# Patient Record
Sex: Female | Born: 1985 | Race: Black or African American | Hispanic: No | Smoking: Never smoker
Health system: Southern US, Community
[De-identification: ages and names within clinical notes are randomized; demographics above are authoritative.]

## PROBLEM LIST (undated history)

## (undated) DIAGNOSIS — D649 Anemia, unspecified: Secondary | ICD-10-CM

## (undated) HISTORY — PX: WISDOM TOOTH EXTRACTION: SHX21

## (undated) HISTORY — PX: BREAST BIOPSY: SHX20

---

## 2010-05-27 ENCOUNTER — Inpatient Hospital Stay (HOSPITAL_COMMUNITY): Admission: AD | Admit: 2010-05-27 | Discharge: 2010-05-27 | Payer: Self-pay | Admitting: Obstetrics & Gynecology

## 2010-05-31 ENCOUNTER — Inpatient Hospital Stay (HOSPITAL_COMMUNITY): Admission: AD | Admit: 2010-05-31 | Discharge: 2010-06-03 | Payer: Self-pay | Admitting: Obstetrics & Gynecology

## 2010-09-16 LAB — CBC
MCH: 25.8 pg — ABNORMAL LOW (ref 26.0–34.0)
MCHC: 32.5 g/dL (ref 30.0–36.0)
Platelets: 228 10*3/uL (ref 150–400)
RBC: 4.43 MIL/uL (ref 3.87–5.11)

## 2010-09-16 LAB — RH IMMUNE GLOB WKUP(>/=20WKS)(NOT WOMEN'S HOSP): Fetal Screen: NEGATIVE

## 2010-09-16 LAB — RPR: RPR Ser Ql: NONREACTIVE

## 2011-02-06 ENCOUNTER — Other Ambulatory Visit (HOSPITAL_COMMUNITY)
Admission: RE | Admit: 2011-02-06 | Discharge: 2011-02-06 | Disposition: A | Discharge status: Home / Self Care | Payer: BC Managed Care – PPO | Source: Ambulatory Visit | Attending: Obstetrics & Gynecology | Admitting: Obstetrics & Gynecology

## 2011-02-06 DIAGNOSIS — Z01419 Encounter for gynecological examination (general) (routine) without abnormal findings: Secondary | ICD-10-CM | POA: Insufficient documentation

## 2011-02-06 DIAGNOSIS — Z113 Encounter for screening for infections with a predominantly sexual mode of transmission: Secondary | ICD-10-CM | POA: Insufficient documentation

## 2011-07-07 NOTE — L&D Delivery Note (Signed)
Delivery Note Progressed quickly to complete dilation and began pushing. At 3:27 PM a viable and healthy female was delivered via Vaginal, Spontaneous Delivery (Presentation: Left Occiput Anterior).  APGAR: 9, 9; weight .   Placenta status: Grossly Intact, Spontaneous.  Cord: 3 vessels with the following complications: None.    No difficulty with shoulders  Anesthesia: Epidural  Episiotomy: None Lacerations: Abrasion at fourchette. Suture Repair: none Est. Blood Loss (mL): 100  Mom to postpartum.  Baby to with mother.  [facility] 12/26/2011, 4:36 PM

## 2011-08-19 LAB — OB RESULTS CONSOLE GC/CHLAMYDIA
Chlamydia: NEGATIVE
Gonorrhea: NEGATIVE

## 2011-12-08 LAB — OB RESULTS CONSOLE GBS: GBS: POSITIVE

## 2011-12-13 ENCOUNTER — Inpatient Hospital Stay (HOSPITAL_COMMUNITY)
Admission: AD | Admit: 2011-12-13 | Discharge: 2011-12-13 | Disposition: A | Discharge status: Home / Self Care | Payer: BC Managed Care – PPO | Source: Ambulatory Visit | Attending: Obstetrics & Gynecology | Admitting: Obstetrics & Gynecology

## 2011-12-13 ENCOUNTER — Encounter (HOSPITAL_COMMUNITY): Payer: Self-pay | Admitting: *Deleted

## 2011-12-13 DIAGNOSIS — O479 False labor, unspecified: Secondary | ICD-10-CM | POA: Insufficient documentation

## 2011-12-13 DIAGNOSIS — R109 Unspecified abdominal pain: Secondary | ICD-10-CM | POA: Insufficient documentation

## 2011-12-13 HISTORY — DX: Anemia, unspecified: D64.9

## 2011-12-13 MED ORDER — CYCLOBENZAPRINE HCL 10 MG PO TABS
10.0000 mg | ORAL_TABLET | Freq: Once | ORAL | Status: AC
  Administered 2011-12-13: 10 mg via ORAL
  Filled 2011-12-13: qty 1

## 2011-12-13 NOTE — MAU Note (Signed)
Pt reports her mucus plug had come out and has had constant  pelvic pressure with cramping in between. Reports good fetal movement

## 2011-12-13 NOTE — Progress Notes (Signed)
Roney Marion CNM notified of patients cervical exam closed, thick, soft, not able to determine if fetus is vertex. CNM on her way to see the patient.

## 2011-12-25 ENCOUNTER — Encounter (HOSPITAL_COMMUNITY): Payer: Self-pay

## 2011-12-25 ENCOUNTER — Inpatient Hospital Stay (HOSPITAL_COMMUNITY)
Admission: AD | Admit: 2011-12-25 | Discharge: 2011-12-28 | DRG: 372 | Disposition: A | Discharge status: Home / Self Care | Payer: BC Managed Care – PPO | Source: Ambulatory Visit | Attending: Obstetrics and Gynecology | Admitting: Obstetrics and Gynecology

## 2011-12-25 DIAGNOSIS — Z22330 Carrier of Group B streptococcus: Secondary | ICD-10-CM

## 2011-12-25 DIAGNOSIS — O4290 Premature rupture of membranes, unspecified as to length of time between rupture and onset of labor, unspecified weeks of gestation: Principal | ICD-10-CM | POA: Diagnosis present

## 2011-12-25 DIAGNOSIS — Z3490 Encounter for supervision of normal pregnancy, unspecified, unspecified trimester: Secondary | ICD-10-CM

## 2011-12-25 DIAGNOSIS — A491 Streptococcal infection, unspecified site: Secondary | ICD-10-CM | POA: Diagnosis present

## 2011-12-25 DIAGNOSIS — O99892 Other specified diseases and conditions complicating childbirth: Secondary | ICD-10-CM | POA: Diagnosis present

## 2011-12-25 DIAGNOSIS — O26899 Other specified pregnancy related conditions, unspecified trimester: Secondary | ICD-10-CM | POA: Diagnosis present

## 2011-12-25 DIAGNOSIS — Z6791 Unspecified blood type, Rh negative: Secondary | ICD-10-CM | POA: Diagnosis present

## 2011-12-25 LAB — CBC
HCT: 31 % — ABNORMAL LOW (ref 36.0–46.0)
Hemoglobin: 9.8 g/dL — ABNORMAL LOW (ref 12.0–15.0)
MCHC: 31.6 g/dL (ref 30.0–36.0)
MCV: 74 fL — ABNORMAL LOW (ref 78.0–100.0)
RDW: 15.3 % (ref 11.5–15.5)

## 2011-12-25 MED ORDER — IBUPROFEN 600 MG PO TABS: 600.0000 mg | ORAL_TABLET | Freq: Four times a day (QID) | ORAL | Status: DC | PRN

## 2011-12-25 MED ORDER — ACETAMINOPHEN 325 MG PO TABS: 650.0000 mg | ORAL_TABLET | ORAL | Status: DC | PRN

## 2011-12-25 MED ORDER — LACTATED RINGERS IV SOLN: 500.0000 mL | INTRAVENOUS | Status: DC | PRN

## 2011-12-25 MED ORDER — MISOPROSTOL 25 MCG QUARTER TABLET
25.0000 ug | ORAL_TABLET | ORAL | Status: DC | PRN
  Administered 2011-12-25: 25 ug via VAGINAL
  Filled 2011-12-25 (×2): qty 0.25

## 2011-12-25 MED ORDER — OXYTOCIN 40 UNITS IN LACTATED RINGERS INFUSION - SIMPLE MED
62.5000 mL/h | Freq: Once | INTRAVENOUS | Status: AC
  Administered 2011-12-26: 2.5 [IU]/h via INTRAVENOUS

## 2011-12-25 MED ORDER — CITRIC ACID-SODIUM CITRATE 334-500 MG/5ML PO SOLN: 30.0000 mL | ORAL | Status: DC | PRN

## 2011-12-25 MED ORDER — ONDANSETRON HCL 4 MG/2ML IJ SOLN: 4.0000 mg | Freq: Four times a day (QID) | INTRAMUSCULAR | Status: DC | PRN

## 2011-12-25 MED ORDER — OXYCODONE-ACETAMINOPHEN 5-325 MG PO TABS: 1.0000 | ORAL_TABLET | ORAL | Status: DC | PRN

## 2011-12-25 MED ORDER — LACTATED RINGERS IV SOLN
INTRAVENOUS | Status: DC
  Administered 2011-12-25 – 2011-12-26 (×3): via INTRAVENOUS

## 2011-12-25 MED ORDER — PENICILLIN G POTASSIUM 5000000 UNITS IJ SOLR
2.5000 10*6.[IU] | INTRAVENOUS | Status: DC
  Administered 2011-12-25 – 2011-12-26 (×5): 2.5 10*6.[IU] via INTRAVENOUS
  Filled 2011-12-25 (×9): qty 2.5

## 2011-12-25 MED ORDER — PENICILLIN G POTASSIUM 5000000 UNITS IJ SOLR
5.0000 10*6.[IU] | Freq: Once | INTRAVENOUS | Status: AC
  Administered 2011-12-25: 5 10*6.[IU] via INTRAVENOUS
  Filled 2011-12-25: qty 5

## 2011-12-25 MED ORDER — OXYTOCIN BOLUS FROM INFUSION
250.0000 mL | Freq: Once | INTRAVENOUS | Status: AC
  Administered 2011-12-26: 250 mL via INTRAVENOUS
  Filled 2011-12-25: qty 500

## 2011-12-25 MED ORDER — FLEET ENEMA 7-19 GM/118ML RE ENEM: 1.0000 | ENEMA | RECTAL | Status: DC | PRN

## 2011-12-25 MED ORDER — TERBUTALINE SULFATE 1 MG/ML IJ SOLN: 0.2500 mg | Freq: Once | INTRAMUSCULAR | Status: AC | PRN

## 2011-12-25 MED ORDER — PENICILLIN G POTASSIUM 5000000 UNITS IJ SOLR: 5.0000 10*6.[IU] | Freq: Once | INTRAVENOUS | Status: DC

## 2011-12-25 MED ORDER — PENICILLIN G POTASSIUM 5000000 UNITS IJ SOLR: 2.5000 10*6.[IU] | INTRAVENOUS | Status: DC

## 2011-12-25 MED ORDER — LIDOCAINE HCL (PF) 1 % IJ SOLN: 30.0000 mL | INTRAMUSCULAR | Status: DC | PRN

## 2011-12-25 NOTE — MAU Note (Signed)
Pt states was 3cm/-1/50%, ?SRom at 1000, clear fluid, non-odorous. Ctx's were q64minutes.

## 2011-12-25 NOTE — Progress Notes (Signed)
Beth Hansen is a 26 y.o. G2P1001 at [redacted]w[redacted]d   Subjective: No complaints.  Discussed plans for induction.  Family members in room  Objective: BP 137/81  Pulse 95  Temp 97.7 F (36.5 C) (Oral)  Resp 20  Ht 5\' 7"  (1.702 m)  Wt 102.967 kg (227 lb)  BMI 35.55 kg/m2  SpO2 100%      FHT:  FHR: 140 bpm, variability: moderate,  accelerations:  Present,  decelerations:  Absent UC:   Regular when able to capture, pt laughing constantly SVE:   Dilation: Closed Effacement (%): Thick Station: -2 Exam by:: Dr Lahoma Rocker  Labs: Lab Results  Component Value Date   WBC 8.2 12/25/2011   HGB 9.8* 12/25/2011   HCT 31.0* 12/25/2011   MCV 74.0* 12/25/2011   PLT 198 12/25/2011    Assessment / Plan: Augmentation of labor, progressing well  Labor: On Cytotec, will recheck cervix at 4o s/p last dose Preeclampsia:  n/a Fetal Wellbeing:  Category I Pain Control:  Labor support without medications and pt requesting epidural if pitocin required I/D:  PCN Anticipated MOD:  NSVD  Andrena Mews, DO [facility] Resident - PGY-1 12/25/2011 9:37 PM

## 2011-12-25 NOTE — MAU Note (Signed)
Patient states she had a gush of clear fluid at 1000. Continues to have a little leaking but not wearing a pad. States contractions every 15 minutes. States no fetal movement today. Fetal heart rate 160 to 175 in triage.

## 2011-12-25 NOTE — H&P (Signed)
S: This is a 26 year old G2P1001 at [redacted]w[redacted]d presenting with ROM approximately 10:30am this morning described as a moderate amount of clear fluid that has continued to leak slowly while laying in bed. She also states that her contractions began yesterday (12/24/11) afternoon that were approximately 15 minutes apart and unknown duration which have continued through this morning when ROM took place. Patient receiving care at [facility]  Complications this pregnancy: none  Contractions: q 9 min  Membranes: ruptured Vaginal bleeding: n/a Vaginal discharge: clear fluid Fetal movement: none today  O: BP 134/73  Pulse 79  Temp 97.9 F (36.6 C) (Oral)  Resp 18  Ht 5\' 7"  (1.702 m)  Wt 102.967 kg (227 lb)  BMI 35.55 kg/m2  SpO2 100%  Filed Vitals:   12/25/11 1359  BP: 134/73  Pulse: 79  Temp: 97.9 F (36.6 C)  Resp: 18   ROS: Constitutional: Decreased appetite. Denies fever, chills, diaphoresis, and fatigue.  HEENT: Denies photophobia, eye pain, redness, hearing loss, ear pain, congestion, sore throat, rhinorrhea, sneezing, mouth sores, trouble swallowing, neck pain, neck stiffness and tinnitus.   Respiratory: Denies SOB, DOE, cough, chest tightness,  and wheezing.   Cardiovascular: Minor leg swelling. Denies chest pain, palpitations Gastrointestinal: Denies nausea, vomiting, abdominal pain, diarrhea, constipation, blood in stool. Genitourinary: Burning on urination s/p ROM.  Musculoskeletal: Denies myalgias, joint swelling, arthralgias and gait problem.  Skin: Denies pallor, rash and wound.  Neurological: Denies dizziness, seizures, syncope, weakness, light-headedness, numbness and headaches.  Hematological: Denies adenopathy. Psychiatric/Behavioral: Denies mood changes, confusion, nervousness, sleep disturbance and agitation  Physical Exam: General: Patient is a well-developed and well-nourished female in no acute distress and cooperative with exam. Alert and oriented x3.  Head:  Normocephalic and atraumatic Mouth: no erythema or exudates, MMM Eyes: EOMI, conjunctivae normal, No scleral icterus.  Neck: Supple, Trachea midline normal ROM, No JVD, mass, thyromegaly, or carotid bruit present.  Cardiovascular: RRR, S1 normal, S2 normal, no MRG, pulses symmetric and intact bilaterally Pulmonary/Chest: CTAB, no wheezes, rales, or rhonchi Abdominal: Gravid, size c/w dates, Non-tender, bowel sounds are normal, no masses, organomegaly, or guarding present.  GU: no CVA tenderness Musculoskeletal: No joint deformities, erythema, or stiffness, ROM full and no nontender Hematology: no cervical, inginal, or axillary adenopathy.  Neurological: A&O x3, cranial nerve II-XII are grossly intact, no focal motor deficit, sensory intact to light touch bilaterally.  Skin: Warm, dry and intact. No rash, cyanosis, or clubbing.  Psychiatric: Normal mood and affect. speech and behavior is normal. Judgment and thought content normal. Cognition and memory are normal.  SVE: deferred FHT: 150bpm, mod variability, + accels, no decels Ctx: q65min  Prenatal labs: ABO, Rh:  O neg Antibody:  Pos Rubella:  Immune RPR:   NR HBsAg: Neg  HIV:   Neg GBS:   Positive HSV: Negative Hgb/Plt:  9.8 / 198 2 hour GTT: 16,10,96 (normal) 1st trimester screen: normal  Labs:  Results for orders placed during the hospital encounter of 12/25/11 (from the past 24 hour(s))  CBC     Status: Abnormal   Collection Time   12/25/11  2:10 PM      Component Value Range   WBC 8.2  4.0 - 10.5 K/uL   RBC 4.19  3.87 - 5.11 MIL/uL   Hemoglobin 9.8 (*) 12.0 - 15.0 g/dL   HCT 04.5 (*) 40.9 - 81.1 %   MCV 74.0 (*) 78.0 - 100.0 fL   MCH 23.4 (*) 26.0 - 34.0 pg   MCHC  31.6  30.0 - 36.0 g/dL   RDW 54.0  98.1 - 19.1 %   Platelets 198  150 - 400 K/uL  TYPE AND SCREEN     Status: Normal   Collection Time   12/25/11  2:10 PM      Component Value Range   ABO/RH(D) O NEG     Antibody Screen POS     Sample Expiration  12/28/2011       A/P: 26 year old G2P1001 @ [redacted]w[redacted]d presents with SROM and ctx q 15 min. 1. Admit to L&D 2. Expectant management, anticipate vaginal delivery 3. Antibiotics, PCN 2/2 SROM and GBS pos. 4. Continuous toco/FHT 5. Epidural for pain when 8/10 per patient  I was present for the exam and agree with above.  [place], [place] 12/25/2011 5:23 PM

## 2011-12-26 ENCOUNTER — Inpatient Hospital Stay (HOSPITAL_COMMUNITY): Payer: BC Managed Care – PPO | Admitting: Anesthesiology

## 2011-12-26 ENCOUNTER — Encounter (HOSPITAL_COMMUNITY): Payer: Self-pay | Admitting: *Deleted

## 2011-12-26 ENCOUNTER — Encounter (HOSPITAL_COMMUNITY): Payer: Self-pay | Admitting: Anesthesiology

## 2011-12-26 DIAGNOSIS — Z22330 Carrier of Group B streptococcus: Secondary | ICD-10-CM

## 2011-12-26 DIAGNOSIS — A491 Streptococcal infection, unspecified site: Secondary | ICD-10-CM | POA: Diagnosis present

## 2011-12-26 DIAGNOSIS — O4290 Premature rupture of membranes, unspecified as to length of time between rupture and onset of labor, unspecified weeks of gestation: Secondary | ICD-10-CM

## 2011-12-26 DIAGNOSIS — Z3490 Encounter for supervision of normal pregnancy, unspecified, unspecified trimester: Secondary | ICD-10-CM

## 2011-12-26 DIAGNOSIS — O9989 Other specified diseases and conditions complicating pregnancy, childbirth and the puerperium: Secondary | ICD-10-CM

## 2011-12-26 DIAGNOSIS — Z6791 Unspecified blood type, Rh negative: Secondary | ICD-10-CM | POA: Diagnosis present

## 2011-12-26 MED ORDER — PHENYLEPHRINE 40 MCG/ML (10ML) SYRINGE FOR IV PUSH (FOR BLOOD PRESSURE SUPPORT): 80.0000 ug | PREFILLED_SYRINGE | INTRAVENOUS | Status: DC | PRN

## 2011-12-26 MED ORDER — FENTANYL 2.5 MCG/ML BUPIVACAINE 1/10 % EPIDURAL INFUSION (WH - ANES)
14.0000 mL/h | INTRAMUSCULAR | Status: DC
  Filled 2011-12-26: qty 60

## 2011-12-26 MED ORDER — OXYTOCIN 40 UNITS IN LACTATED RINGERS INFUSION - SIMPLE MED
1.0000 m[IU]/min | INTRAVENOUS | Status: DC
  Administered 2011-12-26: 1 m[IU]/min via INTRAVENOUS
  Filled 2011-12-26: qty 1000

## 2011-12-26 MED ORDER — TERBUTALINE SULFATE 1 MG/ML IJ SOLN: 0.2500 mg | Freq: Once | INTRAMUSCULAR | Status: DC | PRN

## 2011-12-26 MED ORDER — LANOLIN HYDROUS EX OINT: TOPICAL_OINTMENT | CUTANEOUS | Status: DC | PRN

## 2011-12-26 MED ORDER — EPHEDRINE 5 MG/ML INJ: 10.0000 mg | INTRAVENOUS | Status: DC | PRN

## 2011-12-26 MED ORDER — FENTANYL 2.5 MCG/ML BUPIVACAINE 1/10 % EPIDURAL INFUSION (WH - ANES)
INTRAMUSCULAR | Status: DC | PRN
  Administered 2011-12-26: 14 mL/h via EPIDURAL

## 2011-12-26 MED ORDER — OXYCODONE-ACETAMINOPHEN 5-325 MG PO TABS: 1.0000 | ORAL_TABLET | ORAL | Status: DC | PRN

## 2011-12-26 MED ORDER — LACTATED RINGERS IV SOLN
500.0000 mL | Freq: Once | INTRAVENOUS | Status: AC
  Administered 2011-12-26: 1000 mL via INTRAVENOUS

## 2011-12-26 MED ORDER — EPHEDRINE 5 MG/ML INJ
10.0000 mg | INTRAVENOUS | Status: DC | PRN
Start: 2011-12-26 — End: 2011-12-26
  Filled 2011-12-26: qty 4

## 2011-12-26 MED ORDER — IBUPROFEN 600 MG PO TABS
600.0000 mg | ORAL_TABLET | Freq: Four times a day (QID) | ORAL | Status: DC
  Administered 2011-12-26 – 2011-12-28 (×7): 600 mg via ORAL
  Filled 2011-12-26 (×7): qty 1

## 2011-12-26 MED ORDER — ZOLPIDEM TARTRATE 5 MG PO TABS: 5.0000 mg | ORAL_TABLET | Freq: Every evening | ORAL | Status: DC | PRN

## 2011-12-26 MED ORDER — WITCH HAZEL-GLYCERIN EX PADS
1.0000 "application " | MEDICATED_PAD | CUTANEOUS | Status: DC | PRN
  Administered 2011-12-26: 1 via TOPICAL

## 2011-12-26 MED ORDER — BENZOCAINE-MENTHOL 20-0.5 % EX AERO
1.0000 "application " | INHALATION_SPRAY | CUTANEOUS | Status: DC | PRN
  Administered 2011-12-26: 1 via TOPICAL
  Filled 2011-12-26: qty 56

## 2011-12-26 MED ORDER — DIPHENHYDRAMINE HCL 25 MG PO CAPS: 25.0000 mg | ORAL_CAPSULE | Freq: Four times a day (QID) | ORAL | Status: DC | PRN

## 2011-12-26 MED ORDER — SENNOSIDES-DOCUSATE SODIUM 8.6-50 MG PO TABS
2.0000 | ORAL_TABLET | Freq: Every day | ORAL | Status: DC
  Administered 2011-12-26 – 2011-12-27 (×2): 2 via ORAL

## 2011-12-26 MED ORDER — ONDANSETRON HCL 4 MG PO TABS: 4.0000 mg | ORAL_TABLET | ORAL | Status: DC | PRN

## 2011-12-26 MED ORDER — TETANUS-DIPHTH-ACELL PERTUSSIS 5-2.5-18.5 LF-MCG/0.5 IM SUSP
0.5000 mL | Freq: Once | INTRAMUSCULAR | Status: AC
  Administered 2011-12-27: 0.5 mL via INTRAMUSCULAR
  Filled 2011-12-26: qty 0.5

## 2011-12-26 MED ORDER — ONDANSETRON HCL 4 MG/2ML IJ SOLN: 4.0000 mg | INTRAMUSCULAR | Status: DC | PRN

## 2011-12-26 MED ORDER — PHENYLEPHRINE 40 MCG/ML (10ML) SYRINGE FOR IV PUSH (FOR BLOOD PRESSURE SUPPORT)
80.0000 ug | PREFILLED_SYRINGE | INTRAVENOUS | Status: DC | PRN
  Filled 2011-12-26: qty 5

## 2011-12-26 MED ORDER — PRENATAL MULTIVITAMIN CH
1.0000 | ORAL_TABLET | Freq: Every day | ORAL | Status: DC
  Administered 2011-12-27 – 2011-12-28 (×2): 1 via ORAL
  Filled 2011-12-26 (×2): qty 1

## 2011-12-26 MED ORDER — NALBUPHINE SYRINGE 5 MG/0.5 ML
10.0000 mg | INJECTION | INTRAMUSCULAR | Status: DC | PRN
  Administered 2011-12-26: 10 mg via INTRAVENOUS
  Filled 2011-12-26: qty 1

## 2011-12-26 MED ORDER — DIBUCAINE 1 % RE OINT
1.0000 "application " | TOPICAL_OINTMENT | RECTAL | Status: DC | PRN
  Administered 2011-12-26: 1 via RECTAL
  Filled 2011-12-26: qty 28

## 2011-12-26 MED ORDER — SIMETHICONE 80 MG PO CHEW: 80.0000 mg | CHEWABLE_TABLET | ORAL | Status: DC | PRN

## 2011-12-26 MED ORDER — SODIUM BICARBONATE 8.4 % IV SOLN
INTRAVENOUS | Status: DC | PRN
  Administered 2011-12-26: 4 mL via EPIDURAL

## 2011-12-26 MED ORDER — DIPHENHYDRAMINE HCL 50 MG/ML IJ SOLN: 12.5000 mg | INTRAMUSCULAR | Status: DC | PRN

## 2011-12-26 NOTE — Progress Notes (Signed)
Patient ID: Beth Hansen, female   DOB: 1986/04/20, 26 y.o.   MRN: [id number]  Doing well but hurting more  FHR stable and reassuring UCs every 2-4 minutes  Cervix:  Dilation: 4 Effacement (%): 80 Cervical Position: Posterior Station: -3 Presentation: Vertex Exam by:: Taiana Temkin, cnm  AROM forebag for clear fluids  Will continue plan of care

## 2011-12-26 NOTE — Progress Notes (Signed)
Beth Hansen is a 26 y.o. G2P1001 at [redacted]w[redacted]d Subjective: Some discomfort with contractions, slight dizziness  Objective: BP 130/74  Pulse 80  Temp 97.8 F (36.6 C) (Oral)  Resp 18  Ht 5\' 7"  (1.702 m)  Wt 102.967 kg (227 lb)  BMI 35.55 kg/m2  SpO2 100%      FHT:  FHR: 140 bpm, variability: moderate,  accelerations:  Present,  decelerations:  Absent UC:   regular, every 2-5 minutes SVE:   Dilation: 3.5 Effacement (%): 50 Station: -2 Exam by:: Berline Chough DO  Labs: Lab Results  Component Value Date   WBC 8.2 12/25/2011   HGB 9.8* 12/25/2011   HCT 31.0* 12/25/2011   MCV 74.0* 12/25/2011   PLT 198 12/25/2011    Assessment / Plan: Protracted latent phase  Labor: Foley out, will start Pitocin 1X1 Preeclampsia:  n/a Fetal Wellbeing:  Category I Pain Control:  Nubain, plans for epidural I/D:  PCN Anticipated MOD:  NSVD  Andrena Mews, DO [facility] Resident - PGY-1 12/26/2011 6:13 AM

## 2011-12-26 NOTE — Progress Notes (Signed)
Beth Hansen is a 26 y.o. G2P1001 at [redacted]w[redacted]d  Subjective: Sleeping comfortably.  Contracting regularly  Objective: BP 105/54  Pulse 96  Temp 98.7 F (37.1 C) (Oral)  Resp 18  Ht 5\' 7"  (1.702 m)  Wt 102.967 kg (227 lb)  BMI 35.55 kg/m2  SpO2 100%      FHT:  FHR: 145 bpm, variability: moderate,  accelerations:  Present,  decelerations:  Absent UC:   regular, every 2-4 minutes SVE:   Dilation: 1.5 Effacement (%): 50 Station: -2 Exam by:: Berline Chough DO  Labs: Lab Results  Component Value Date   WBC 8.2 12/25/2011   HGB 9.8* 12/25/2011   HCT 31.0* 12/25/2011   MCV 74.0* 12/25/2011   PLT 198 12/25/2011    Assessment / Plan: Augmentation of labor, progressing well  Labor: S/p cytotec, regular contractions, cervical changes; FOLEY PLACED Preeclampsia:  n/a Fetal Wellbeing:  Category I Pain Control:  Labor support without medications and pt to request epidural when contractions increase I/D:  PCN Anticipated MOD:  NSVD  Andrena Mews, DO [facility] Resident - PGY-1 12/26/2011 2:01 AM

## 2011-12-26 NOTE — Progress Notes (Signed)
Beth Hansen is a 26 y.o. G2P1001 at [redacted]w[redacted]d by Subjective: Resting comfortable, no complaints  Objective: BP 105/54  Pulse 96  Temp 98.9 F (37.2 C) (Oral)  Resp 20  Ht 5\' 7"  (1.702 m)  Wt 102.967 kg (227 lb)  BMI 35.55 kg/m2  SpO2 100%      FHT:  FHR: 140 bpm, variability: moderate,  accelerations:  Present,  decelerations:  Absent UC:   regular, every 2-3 minutes SVE:   Dilation: Fingertip Effacement (%): Thick Station: -2 Exam by:: Berline Chough DO  Labs: Lab Results  Component Value Date   WBC 8.2 12/25/2011   HGB 9.8* 12/25/2011   HCT 31.0* 12/25/2011   MCV 74.0* 12/25/2011   PLT 198 12/25/2011    Assessment / Plan: Augmentation of labor, progressing well  Labor: Progressing with regular contractions since last cytotec Preeclampsia:  na Fetal Wellbeing:  Category I Pain Control:  Labor support without medications and requesting epidural when contractions worsen I/D:  PCN Anticipated MOD:  NSVD  Andrena Mews, DO [facility] Resident - PGY-1 12/26/2011 12:18 AM

## 2011-12-26 NOTE — Progress Notes (Signed)
  Patient ID: Beth Hansen, female   DOB: October 09, 1985, 26 y.o.   MRN: [id number]  S/O:  Doing well. Wants to be checked          Nubain helps with pain.  Not ready for epidural yet          FHR reactive          UCs every 3 min or so, moderate          Cervix:  Deferred  A:  SIUP at [redacted]w[redacted]d       Latent phase labor      PROM at term      Pitocin Augmentation  P:  DIscussed with patient our usual practice with ROM is to limit exams       Discussed pain management       Will recheck cervix when wants more pain med or epidural

## 2011-12-26 NOTE — Anesthesia Preprocedure Evaluation (Addendum)

## 2011-12-26 NOTE — Anesthesia Procedure Notes (Signed)

## 2011-12-27 MED ORDER — RHO D IMMUNE GLOBULIN 1500 UNIT/2ML IJ SOLN
300.0000 ug | Freq: Once | INTRAMUSCULAR | Status: AC
  Administered 2011-12-27: 300 ug via INTRAMUSCULAR
  Filled 2011-12-27: qty 2

## 2011-12-27 NOTE — Progress Notes (Signed)
Post Partum Day 1 Subjective: no complaints, up ad lib, voiding and tolerating PO  Objective: Blood pressure 111/71, pulse 80, temperature 98.1 F (36.7 C), temperature source Oral, resp. rate 18, height 5\' 7"  (1.702 m), weight 102.967 kg (227 lb), SpO2 100.00%, unknown if currently breastfeeding.  Physical Exam:  General: alert, cooperative, appears stated age and no distress Lochia: appropriate Uterine Fundus: firm Incision: n/a DVT Evaluation: No evidence of DVT seen on physical exam. Negative Homan's sign. No cords or calf tenderness. No significant calf/ankle edema.   Basename 12/25/11 1410  HGB 9.8*  HCT 31.0*    Assessment/Plan: Plan for discharge tomorrow Mirena for contraceptions Lactation consult   LOS: 2 days   Andrena Mews, DO [facility] Resident - PGY-1 12/27/2011 7:17 AM

## 2011-12-27 NOTE — Progress Notes (Signed)
I have seen this patient and agree with the above resident's note.  LEFTWICH-KIRBY, Kyliana Standen Certified Nurse-Midwife 

## 2011-12-27 NOTE — Anesthesia Postprocedure Evaluation (Signed)
Anesthesia Post Note  Patient: Beth Hansen  Procedure(s) Performed: * No procedures listed *  Anesthesia type: Epidural  Patient location: Mother/Baby  Post pain: Pain level controlled  Post assessment: Post-op Vital signs reviewed  Last Vitals:  Filed Vitals:   12/27/11 0500  BP: 111/71  Pulse: 80  Temp: 36.7 C  Resp: 18    Post vital signs: Reviewed  Level of consciousness: awake  Complications: No apparent anesthesia complications

## 2011-12-28 LAB — RH IG WORKUP (INCLUDES ABO/RH)
ABO/RH(D): O NEG
Fetal Screen: NEGATIVE
Gestational Age(Wks): 38.2

## 2011-12-28 MED ORDER — PRENATAL MULTIVITAMIN CH: 1.0000 | ORAL_TABLET | Freq: Every day | ORAL | Status: DC

## 2011-12-28 MED ORDER — IBUPROFEN 600 MG PO TABS: 600.0000 mg | ORAL_TABLET | Freq: Four times a day (QID) | ORAL | Status: AC

## 2011-12-28 NOTE — H&P (Signed)
Agree with above note.  Beth Hansen 12/28/2011 1:17 PM

## 2011-12-28 NOTE — Discharge Instructions (Signed)
Vaginal Delivery Care After  Change your pad on each trip to the bathroom.   Wipe gently with toilet paper during your hospital stay. Always wipe from front to back. A spray bottle with warm tap water could also be used or a towelette if available.   Place your soiled pad and toilet paper in a bathroom wastebasket with a plastic bag liner.   During your hospital stay, save any clots. If you pass a clot while on the toilet, do not flush it. Also, if your vaginal flow seems excessive to you, notify nursing personnel.   The first time you get out of bed after delivery, wait for assistance from a nurse. Do not get up alone at any time if you feel weak or dizzy.   Bend and extend your ankles forcefully so that you feel the calves of your legs get hard. Do this 6 times every hour when you are in bed and awake.   Do not sit with one foot under you, dangle your legs over the edge of the bed, or maintain a position that hinders the circulation in your legs.   Many women experience after pains for 2 to 3 days after delivery. These after pains are mild uterine contractions. Ask the nurse for a pain medication if you need something for this. Sometimes breastfeeding stimulates after pains; if you find this to be true, ask for the medication  -  hour before the next feeding.   For you and your infant's protection, do not go beyond the door(s) of the obstetric unit. Do not carry your baby in your arms in the hallway. When taking your baby to and from your room, put your baby in the bassinet and push the bassinet.   Mothers may have their babies in their room as much as they desire.  Document Released: 06/19/2000 Document Revised: 06/11/2011 Document Reviewed: 05/20/2007 Exit[employer] Patient Information 2012 [employer]. 

## 2011-12-28 NOTE — Discharge Summary (Signed)
Obstetric Discharge Summary Reason for Admission: onset of labor Prenatal Procedures: none Intrapartum Procedures: spontaneous vaginal delivery Postpartum Procedures: none Complications-Operative and Postpartum: none Hemoglobin  Date Value Range Status  12/25/2011 9.8* 12.0 - 15.0 g/dL Final     HCT  Date Value Range Status  12/25/2011 31.0* 36.0 - 46.0 % Final    Physical Exam:  General: alert, cooperative, appears stated age and no distress Lochia: appropriate Uterine Fundus: firm Incision: n/a DVT Evaluation: No evidence of DVT seen on physical exam. Negative Homan's sign. No cords or calf tenderness.  Discharge Diagnoses: Term Pregnancy-delivered  Discharge Information: Date: 12/28/2011 Activity: pelvic rest Diet: routine Medications: Ibuprofen Condition: stable Instructions: refer to practice specific booklet Discharge to: home Follow-up Information    Follow up with Tilda Burrow, MD. Schedule an appointment as soon as possible for a visit in 6 weeks.   Contact information:   [facility] [address] [place] [address] [phone]          Newborn Data: Live born female  Birth Weight: 6 lb 12.8 oz (3085 g) APGAR: 9, 9  Home with mother.  Plans to breast/bottle feed Would like Mirena at 6 week PPV  RIGBY, MICHAEL 12/28/2011, 7:43 AM  I have seen and examined this patient and I agree with the above. Cam Hai 7:48 AM 12/28/2011

## 2011-12-29 LAB — TYPE AND SCREEN
Antibody Screen: POSITIVE
DAT, IgG: NEGATIVE
Unit division: 0

## 2012-01-05 NOTE — Discharge Summary (Signed)
Agree with above note.  LEGGETT,KELLY H. 01/05/2012 8:22 PM

## 2013-10-04 ENCOUNTER — Ambulatory Visit (INDEPENDENT_AMBULATORY_CARE_PROVIDER_SITE_OTHER): Payer: Medicaid Other | Admitting: Obstetrics & Gynecology

## 2013-10-04 ENCOUNTER — Other Ambulatory Visit (HOSPITAL_COMMUNITY)
Admission: RE | Admit: 2013-10-04 | Discharge: 2013-10-04 | Disposition: A | Discharge status: Home / Self Care | Payer: Medicaid Other | Source: Ambulatory Visit | Attending: Obstetrics & Gynecology | Admitting: Obstetrics & Gynecology

## 2013-10-04 ENCOUNTER — Encounter: Payer: Self-pay | Admitting: Obstetrics & Gynecology

## 2013-10-04 ENCOUNTER — Encounter (INDEPENDENT_AMBULATORY_CARE_PROVIDER_SITE_OTHER): Payer: Self-pay

## 2013-10-04 VITALS — BP 110/70 | Ht 69.0 in | Wt 183.0 lb

## 2013-10-04 DIAGNOSIS — Z3049 Encounter for surveillance of other contraceptives: Secondary | ICD-10-CM

## 2013-10-04 DIAGNOSIS — Z01419 Encounter for gynecological examination (general) (routine) without abnormal findings: Secondary | ICD-10-CM

## 2013-10-04 NOTE — Addendum Note (Signed)
Addended by: Doyne Keel on: 10/04/2013 04:07 PM   Modules accepted: Orders

## 2013-10-04 NOTE — Progress Notes (Signed)
Patient ID: Beth Hansen, female   DOB: Sep 04, 1985, 28 y.o.   MRN: [id number] Subjective:     Beth Hansen is a 28 y.o. female here for a routine exam.  Patient's last menstrual period was 09/17/2013. [id number] Birth Control Method:  mirena Menstrual Calendar(currently): irregular  Current complaints: none.  Want bilateral salpingectomy Current acute medical issues:  none   Recent Gynecologic History Patient's last menstrual period was 09/17/2013. Last Pap: 2013,  normal Last mammogram: ,    Past Medical History  Diagnosis Date  . Anemia     Past Surgical History  Procedure Laterality Date  . Wisdom tooth extraction      OB History   Grav Para Term Preterm Abortions TAB SAB Ect Mult Living   2 2 2  0 0 0 0 0 0 2      History   Social History  . Marital Status: Married    Spouse Name: N/A    Number of Children: N/A  . Years of Education: N/A   Social History Main Topics  . Smoking status: Never Smoker   . Smokeless tobacco: Never Used  . Alcohol Use: No  . Drug Use: No  . Sexual Activity: Yes    Birth Control/ Protection: None, IUD   Other Topics Concern  . None   Social History Narrative  . None    Family History  Problem Relation Age of Onset  . Other Neg Hx   . Hypertension Father   . Diabetes Maternal Grandmother   . Hypertension Maternal Grandmother      Review of Systems  Review of Systems  Constitutional: Negative for fever, chills, weight loss, malaise/fatigue and diaphoresis.  HENT: Negative for hearing loss, ear pain, nosebleeds, congestion, sore throat, neck pain, tinnitus and ear discharge.   Eyes: Negative for blurred vision, double vision, photophobia, pain, discharge and redness.  Respiratory: Negative for cough, hemoptysis, sputum production, shortness of breath, wheezing and stridor.   Cardiovascular: Negative for chest pain, palpitations, orthopnea, claudication, leg swelling and PND.  Gastrointestinal: negative for abdominal  pain. Negative for heartburn, nausea, vomiting, diarrhea, constipation, blood in stool and melena.  Genitourinary: Negative for dysuria, urgency, frequency, hematuria and flank pain.  Musculoskeletal: Negative for myalgias, back pain, joint pain and falls.  Skin: Negative for itching and rash.  Neurological: Negative for dizziness, tingling, tremors, sensory change, speech change, focal weakness, seizures, loss of consciousness, weakness and headaches.  Endo/Heme/Allergies: Negative for environmental allergies and polydipsia. Does not bruise/bleed easily.  Psychiatric/Behavioral: Negative for depression, suicidal ideas, hallucinations, memory loss and substance abuse. The patient is not nervous/anxious and does not have insomnia.        Objective:    Physical Exam   SKIN 2 moles 1 upper left chest the other behind left axilla Vitals reviewed. Constitutional: She is oriented to person, place, and time. She appears well-developed and well-nourished.  HENT:  Head: Normocephalic and atraumatic.        Right Ear: External ear normal.  Left Ear: External ear normal.  Nose: Nose normal.  Mouth/Throat: Oropharynx is clear and moist.  Eyes: Conjunctivae and EOM are normal. Pupils are equal, round, and reactive to light. Right eye exhibits no discharge. Left eye exhibits no discharge. No scleral icterus.  Neck: Normal range of motion. Neck supple. No tracheal deviation present. No thyromegaly present.  Cardiovascular: Normal rate, regular rhythm, normal heart sounds and intact distal pulses.  Exam reveals no gallop and no friction rub.   No  murmur heard. Respiratory: Effort normal and breath sounds normal. No respiratory distress. She has no wheezes. She has no rales. She exhibits no tenderness.  GI: Soft. Bowel sounds are normal. She exhibits no distension and no mass. There is no tenderness. There is no rebound and no guarding.  Genitourinary:  Breasts no masses skin changes or nipple changes  bilaterally      Vulva is normal without lesions Vagina is pink moist without discharge Cervix normal in appearance and pap is done Uterus is normal size shape and contour Adnexa is negative with normal sized ovaries   Musculoskeletal: Normal range of motion. She exhibits no edema and no tenderness.  Neurological: She is alert and oriented to person, place, and time. She has normal reflexes. She displays normal reflexes. No cranial nerve deficit. She exhibits normal muscle tone. Coordination normal.  Skin: Skin is warm and dry. No rash noted. No erythema. No pallor.  Psychiatric: She has a normal mood and affect. Her behavior is normal. Judgment and thought content normal.       Assessment:    Healthy female exam.    Plan:    Follow up in: 6 weeks.   Schedule lparoscopic salpingectomy for sterilization a 2 moles removal

## 2013-11-17 ENCOUNTER — Encounter (HOSPITAL_COMMUNITY): Payer: Self-pay

## 2013-11-28 ENCOUNTER — Other Ambulatory Visit: Payer: Self-pay | Admitting: Obstetrics & Gynecology

## 2013-11-28 NOTE — Patient Instructions (Signed)
Beth Hansen  11/28/2013   Your procedure is scheduled on:  12/06/13  Report to [facility] at 0830 AM.  Call this number if you have problems the morning of surgery: [phone]   Remember:   Do not eat food or drink liquids after midnight.   Take these medicines the morning of surgery with A SIP OF WATER:    Do not wear jewelry, make-up or nail polish.  Do not wear lotions, powders, or perfumes. You may wear deodorant.  Do not shave 48 hours prior to surgery. Men may shave face and neck.  Do not bring valuables to the hospital.  [facility] is not responsible for any belongings or valuables.               Contacts, dentures or bridgework may not be worn into surgery.  Leave suitcase in the car. After surgery it may be brought to your room.  For patients admitted to the hospital, discharge time is determined by your                treatment team.               Patients discharged the day of surgery will not be allowed to drive  home.  Name and phone number of your driver:   Special Instructions: Shower using CHG 2 nights before surgery and the night before surgery.  If you shower the day of surgery use CHG.  Use special wash - you have one bottle of CHG for all showers.  You should use approximately 1/3 of the bottle for each shower.   Please read over the following fact sheets that you were given: Pain Booklet, Coughing and Deep Breathing, Surgical Site Infection Prevention and Anesthesia Post-op Instructions PATIENT INSTRUCTIONS POST-ANESTHESIA  IMMEDIATELY FOLLOWING SURGERY:  Do not drive or operate machinery for the first twenty four hours after surgery.  Do not make any important decisions for twenty four hours after surgery or while taking narcotic pain medications or sedatives.  If you develop intractable nausea and vomiting or a severe headache please notify your doctor immediately.  FOLLOW-UP:  Please make an appointment with your surgeon as instructed. You do not need to  follow up with anesthesia unless specifically instructed to do so.  WOUND CARE INSTRUCTIONS (if applicable):  Keep a dry clean dressing on the anesthesia/puncture wound site if there is drainage.  Once the wound has quit draining you may leave it open to air.  Generally you should leave the bandage intact for twenty four hours unless there is drainage.  If the epidural site drains for more than 36-48 hours please call the anesthesia department.  QUESTIONS?:  Please feel free to call your physician or the hospital operator if you have any questions, and they will be happy to assist you.      Salpingectomy, Care After Refer to this sheet in the next few weeks. These instructions provide you with information on caring for yourself after your procedure. Your health care provider may also give you more specific instructions. Your treatment has been planned according to current medical practices, but problems sometimes occur. Call your health care provider if you have any problems or questions after your procedure. WHAT TO EXPECT AFTER THE PROCEDURE After your procedure, it is typical to have the following:  Abdominal pain that can be controlled with pain medicine.  Vaginal spotting.  Tiredness. HOME CARE INSTRUCTIONS  Get plenty of rest and sleep.  Only take  over-the-counter or prescription medicines as directed by your health care provider.  Keep incision areas clean and dry. Remove or change any bandages (dressings) only as directed by your health care provider.  You may resume your regular diet. Eat a well-balanced diet.  Drink enough fluids to keep your urine clear or pale yellow.  Limit exercise and activities as directed by your health care provider. Do not lift anything heavier than 5 lb (2.3 kg) until your health care provider approves.  Do not drive until your health care provider approves.  Do not have sexual intercourse until your health care provider says it is okay.  Take  your temperature twice a day for the first week. Write those temperatures down.  Follow up with your health care provider as directed. SEEK MEDICAL CARE IF:  You have pain when you urinate.  You see pus coming out of the incision, or the incision is separating.  You have increasing abdominal pain.  You have swelling or redness in the incision area.  You develop a rash.  You feel lightheaded.  You have pain that is not controlled with medicine. SEEK IMMEDIATE MEDICAL CARE IF:  You develop a fever.  You have increasing abdominal pain.  You develop chest or leg pain.  You develop shortness of breath.  You pass out. Document Released: 09/26/2010 Document Revised: 04/12/2013 Document Reviewed: 12/14/2012 [facility] Patient Information 2014 [place], [place]. Salpingectomy Salpingectomy, also called tubectomy, is the surgical removal of one of the fallopian tubes. The fallopian tubes are tubes that are connected to the uterus. These tubes transport the egg from the ovary to the uterus. A salpingectomy may be done for various reasons, including:   A tubal (ectopic) pregnancy. This is especially true if the tube ruptures.  An infected fallopian tube.  The need to remove the fallopian tube when removing an ovary with a cyst or tumor.  The need to remove the fallopian tube when removing the uterus.  Cancer of the fallopian tube or nearby organs. Removing one fallopian tube does not prevent you from becoming pregnant. It also does not cause problems with your menstrual periods.  LET [facility] CARE PROVIDER KNOW ABOUT:  Any allergies you have.  All medicines you are taking, including vitamins, herbs, eye drops, creams, and over-the-counter medicines.  Previous problems you or members of your family have had with the use of anesthetics.  Any blood disorders you have.  Previous surgeries you have had.  Medical conditions you have. RISKS AND COMPLICATIONS  Generally, this is  a safe procedure. However, as with any procedure, complications can occur. Possible complications include:  Injury to surrounding organs.  Bleeding.  Infection.  Problems related to anesthesia. BEFORE THE PROCEDURE  Ask your health care provider about changing or stopping your regular medicines. You may need to stop taking certain medicines, such as aspirin or blood thinners, at least 1 week before the surgery.  Do not eat or drink anything for at least 8 hours before the surgery.  If you smoke, do not smoke for at least 2 weeks before the surgery.  Make plans to have someone drive you home after the procedure or after your hospital stay. Also arrange for someone to help you with activities during recovery. PROCEDURE   You will be given medicine to help you relax before the procedure (sedative). You will then be given medicine to make you sleep through the procedure (general anesthetic). These medicines will be given through an IV access tube that  is put into one of your veins.  Once you are asleep, your lower abdomen will be shaved and cleaned. A thin, flexible tube (catheter) will be placed in your bladder.  The surgeon may use a laparoscopic, robotic, or open technique for this surgery:  In the laparoscopic technique, the surgery is done through two small cuts (incisions) in the abdomen. A thin, lighted tube with a tiny camera on the end (laparoscope) is inserted into one of the incisions. The tools needed for the procedure are put through the other incision.  A robotic technique may be chosen to perform complex surgery in a small space. In the robotic technique, small incisions will be made. A camera and surgical instruments are passed through the incisions. Surgical instruments will be controlled with the help of a robotic arm.  In the open technique, the surgery is done through one large incision in the abdomen.  Using any of these techniques, the surgeon removes the fallopian  tube from where it attaches to the uterus. The blood vessels will be clamped and tied.  The surgeon then uses staples or stitches to close the incision or incisions. AFTER THE PROCEDURE   You will be taken to a recovery area where your progress will be monitored for 1 3 hours.  If the laparoscopic technique was used, you may be allowed to go home after several hours. You may have some shoulder pain after the laparoscopic procedure. This is normal and usually goes away in a day or two.  If the open technique was used, you will be admitted to the hospital for a couple of days.  You will be given pain medicine if needed.  The IV access tube and catheter will be removed before you are discharged. Document Released: 11/08/2008 Document Revised: 04/12/2013 Document Reviewed: 12/14/2012 [facility] Patient Information 2014 [place], [place].

## 2013-11-29 ENCOUNTER — Encounter (HOSPITAL_COMMUNITY): Payer: Self-pay

## 2013-11-29 ENCOUNTER — Telehealth: Payer: Self-pay | Admitting: Obstetrics & Gynecology

## 2013-11-29 ENCOUNTER — Encounter (HOSPITAL_COMMUNITY)
Admission: RE | Admit: 2013-11-29 | Discharge: 2013-11-29 | Disposition: A | Discharge status: Home / Self Care | Payer: Medicaid Other | Source: Ambulatory Visit | Attending: Obstetrics & Gynecology | Admitting: Obstetrics & Gynecology

## 2013-11-29 ENCOUNTER — Telehealth: Payer: Self-pay | Admitting: *Deleted

## 2013-11-29 DIAGNOSIS — Z01812 Encounter for preprocedural laboratory examination: Secondary | ICD-10-CM | POA: Insufficient documentation

## 2013-11-29 LAB — URINE MICROSCOPIC-ADD ON

## 2013-11-29 LAB — COMPREHENSIVE METABOLIC PANEL
ALT: 7 U/L (ref 0–35)
AST: 13 U/L (ref 0–37)
Albumin: 3.7 g/dL (ref 3.5–5.2)
Alkaline Phosphatase: 61 U/L (ref 39–117)
BILIRUBIN TOTAL: 0.6 mg/dL (ref 0.3–1.2)
BUN: 7 mg/dL (ref 6–23)
CO2: 29 mEq/L (ref 19–32)
Calcium: 9.1 mg/dL (ref 8.4–10.5)
Chloride: 103 mEq/L (ref 96–112)
Creatinine, Ser: 0.74 mg/dL (ref 0.50–1.10)
GFR calc Af Amer: >90 mL/min (ref >90)
GFR calc non Af Amer: >90 mL/min (ref >90)
Glucose, Bld: 84 mg/dL (ref 70–99)
Potassium: 4.4 mEq/L (ref 3.7–5.3)
Sodium: 140 mEq/L (ref 137–147)
TOTAL PROTEIN: 7.8 g/dL (ref 6.0–8.3)

## 2013-11-29 LAB — CBC
HCT: 36.9 % (ref 36.0–46.0)
Hemoglobin: 12 g/dL (ref 12.0–15.0)
MCH: 27 pg (ref 26.0–34.0)
MCHC: 32.5 g/dL (ref 30.0–36.0)
MCV: 82.9 fL (ref 78.0–100.0)
Platelets: 246 10*3/uL (ref 150–400)
RBC: 4.45 MIL/uL (ref 3.87–5.11)
RDW: 12.7 % (ref 11.5–15.5)
WBC: 3.8 10*3/uL — ABNORMAL LOW (ref 4.0–10.5)

## 2013-11-29 LAB — URINALYSIS, ROUTINE W REFLEX MICROSCOPIC
Bilirubin Urine: NEGATIVE
Glucose, UA: NEGATIVE mg/dL
Hgb urine dipstick: NEGATIVE
Ketones, ur: NEGATIVE mg/dL
Leukocytes, UA: NEGATIVE
NITRITE: POSITIVE — AB
PROTEIN: NEGATIVE mg/dL
Urobilinogen, UA: 0.2 mg/dL (ref 0.0–1.0)
pH: 6 (ref 5.0–8.0)

## 2013-11-29 LAB — HCG, QUANTITATIVE, PREGNANCY

## 2013-11-29 NOTE — Telephone Encounter (Signed)
done

## 2013-11-29 NOTE — Pre-Procedure Instructions (Signed)
Patient given information to sign up for my chart. 

## 2013-12-05 NOTE — Telephone Encounter (Addendum)
Pt states last BM on Sunday, having surgery tomorrow with Dr. [name]. Pt states she does use suppositories and they help. Pt states has internal and external hemorrhoids. Pt also states was not given any bowel prep at pre-op for surgery. Per Dr. Glo Herring continue suppositories as needed, usually do not do bowel prep prior to tubaligations. Pt also informed to let nurse/Dr. [name] know concerns tomorrow prior to surgery so they can be sure to send pt home with necessary prescriptions. Pt verbalized understanding.

## 2013-12-05 NOTE — OR Nursing (Signed)
Office called and spoke with Beth Hansen. [name] not available. Plan of care states that two moles will be removed at time of Bilateral Salpingectomy. She is to call back for clarification of consent order.

## 2013-12-06 ENCOUNTER — Ambulatory Visit (HOSPITAL_COMMUNITY)
Admission: RE | Admit: 2013-12-06 | Discharge: 2013-12-06 | Disposition: A | Discharge status: Home / Self Care | Payer: Medicaid Other | Source: Ambulatory Visit | Attending: Obstetrics & Gynecology | Admitting: Obstetrics & Gynecology

## 2013-12-06 ENCOUNTER — Encounter (HOSPITAL_COMMUNITY): Payer: Self-pay | Admitting: *Deleted

## 2013-12-06 ENCOUNTER — Encounter (HOSPITAL_COMMUNITY): Payer: Medicaid Other | Admitting: Anesthesiology

## 2013-12-06 ENCOUNTER — Ambulatory Visit (HOSPITAL_COMMUNITY): Payer: Medicaid Other | Admitting: Anesthesiology

## 2013-12-06 ENCOUNTER — Encounter (HOSPITAL_COMMUNITY)
Admission: RE | Disposition: A | Discharge status: Home / Self Care | Payer: Self-pay | Source: Ambulatory Visit | Attending: Obstetrics & Gynecology

## 2013-12-06 DIAGNOSIS — Z30432 Encounter for removal of intrauterine contraceptive device: Secondary | ICD-10-CM

## 2013-12-06 DIAGNOSIS — Z302 Encounter for sterilization: Secondary | ICD-10-CM

## 2013-12-06 DIAGNOSIS — Z9851 Tubal ligation status: Secondary | ICD-10-CM

## 2013-12-06 HISTORY — PX: LAPAROSCOPIC TUBAL LIGATION: SHX1937

## 2013-12-06 HISTORY — PX: IUD REMOVAL: SHX5392

## 2013-12-06 SURGERY — REMOVAL, INTRAUTERINE DEVICE
Anesthesia: General | Site: Vagina

## 2013-12-06 MED ORDER — LACTATED RINGERS IV SOLN
INTRAVENOUS | Status: DC
  Administered 2013-12-06: 1000 mL via INTRAVENOUS

## 2013-12-06 MED ORDER — GLYCOPYRROLATE 0.2 MG/ML IJ SOLN
INTRAMUSCULAR | Status: AC
  Filled 2013-12-06: qty 3

## 2013-12-06 MED ORDER — NEOSTIGMINE METHYLSULFATE 10 MG/10ML IV SOLN
INTRAVENOUS | Status: DC | PRN
  Administered 2013-12-06: 4 mg via INTRAVENOUS

## 2013-12-06 MED ORDER — ROCURONIUM BROMIDE 50 MG/5ML IV SOLN
INTRAVENOUS | Status: AC
  Filled 2013-12-06: qty 1

## 2013-12-06 MED ORDER — ONDANSETRON HCL 4 MG/2ML IJ SOLN
4.0000 mg | Freq: Once | INTRAMUSCULAR | Status: AC | PRN
  Administered 2013-12-06: 4 mg via INTRAVENOUS

## 2013-12-06 MED ORDER — ROCURONIUM BROMIDE 100 MG/10ML IV SOLN
INTRAVENOUS | Status: DC | PRN
  Administered 2013-12-06: 10 mg via INTRAVENOUS
  Administered 2013-12-06: 35 mg via INTRAVENOUS

## 2013-12-06 MED ORDER — KETOROLAC TROMETHAMINE 30 MG/ML IJ SOLN
30.0000 mg | Freq: Once | INTRAMUSCULAR | Status: AC
  Administered 2013-12-06: 30 mg via INTRAVENOUS
  Filled 2013-12-06: qty 1

## 2013-12-06 MED ORDER — SUCCINYLCHOLINE CHLORIDE 20 MG/ML IJ SOLN
INTRAMUSCULAR | Status: AC
  Filled 2013-12-06: qty 1

## 2013-12-06 MED ORDER — FENTANYL CITRATE 0.05 MG/ML IJ SOLN
25.0000 ug | INTRAMUSCULAR | Status: DC | PRN
  Administered 2013-12-06 (×2): 50 ug via INTRAVENOUS

## 2013-12-06 MED ORDER — FENTANYL CITRATE 0.05 MG/ML IJ SOLN
INTRAMUSCULAR | Status: AC
  Filled 2013-12-06: qty 5

## 2013-12-06 MED ORDER — SODIUM CHLORIDE 0.9 % IR SOLN
Status: DC | PRN
  Administered 2013-12-06: 1000 mL

## 2013-12-06 MED ORDER — BUPIVACAINE LIPOSOME 1.3 % IJ SUSP
INTRAMUSCULAR | Status: AC
  Filled 2013-12-06: qty 20

## 2013-12-06 MED ORDER — HYDROCODONE-ACETAMINOPHEN 5-325 MG PO TABS: 1.0000 | ORAL_TABLET | Freq: Four times a day (QID) | ORAL | Status: DC | PRN

## 2013-12-06 MED ORDER — KETOROLAC TROMETHAMINE 10 MG PO TABS: 10.0000 mg | ORAL_TABLET | Freq: Three times a day (TID) | ORAL | Status: DC | PRN

## 2013-12-06 MED ORDER — LIDOCAINE HCL (CARDIAC) 20 MG/ML IV SOLN
INTRAVENOUS | Status: DC | PRN
  Administered 2013-12-06: 10 mg via INTRAVENOUS
  Administered 2013-12-06: 50 mg via INTRAVENOUS

## 2013-12-06 MED ORDER — MIDAZOLAM HCL 2 MG/2ML IJ SOLN
INTRAMUSCULAR | Status: AC
  Filled 2013-12-06: qty 2

## 2013-12-06 MED ORDER — BUPIVACAINE LIPOSOME 1.3 % IJ SUSP: 20.0000 mL | Freq: Once | INTRAMUSCULAR | Status: DC

## 2013-12-06 MED ORDER — MIDAZOLAM HCL 2 MG/2ML IJ SOLN
1.0000 mg | INTRAMUSCULAR | Status: DC | PRN
  Administered 2013-12-06: 2 mg via INTRAVENOUS

## 2013-12-06 MED ORDER — FENTANYL CITRATE 0.05 MG/ML IJ SOLN
INTRAMUSCULAR | Status: AC
  Filled 2013-12-06: qty 2

## 2013-12-06 MED ORDER — CEFAZOLIN SODIUM-DEXTROSE 2-3 GM-% IV SOLR
2.0000 g | INTRAVENOUS | Status: AC
  Administered 2013-12-06: 2 g via INTRAVENOUS
  Filled 2013-12-06: qty 50

## 2013-12-06 MED ORDER — ONDANSETRON HCL 8 MG PO TABS: 8.0000 mg | ORAL_TABLET | Freq: Three times a day (TID) | ORAL | Status: DC | PRN

## 2013-12-06 MED ORDER — ONDANSETRON HCL 4 MG/2ML IJ SOLN
4.0000 mg | Freq: Once | INTRAMUSCULAR | Status: AC
  Administered 2013-12-06: 4 mg via INTRAVENOUS
  Filled 2013-12-06: qty 2

## 2013-12-06 MED ORDER — FENTANYL CITRATE 0.05 MG/ML IJ SOLN
INTRAMUSCULAR | Status: DC | PRN
  Administered 2013-12-06: 50 ug via INTRAVENOUS
  Administered 2013-12-06: 100 ug via INTRAVENOUS
  Administered 2013-12-06 (×2): 50 ug via INTRAVENOUS

## 2013-12-06 MED ORDER — LACTATED RINGERS IV SOLN
INTRAVENOUS | Status: DC | PRN
  Administered 2013-12-06 (×2): via INTRAVENOUS

## 2013-12-06 MED ORDER — LIDOCAINE HCL (PF) 1 % IJ SOLN
INTRAMUSCULAR | Status: AC
  Filled 2013-12-06: qty 5

## 2013-12-06 MED ORDER — NEOSTIGMINE METHYLSULFATE 10 MG/10ML IV SOLN
INTRAVENOUS | Status: AC
  Filled 2013-12-06: qty 1

## 2013-12-06 MED ORDER — BUPIVACAINE LIPOSOME 1.3 % IJ SUSP
INTRAMUSCULAR | Status: DC | PRN
  Administered 2013-12-06: 13 mL

## 2013-12-06 MED ORDER — GLYCOPYRROLATE 0.2 MG/ML IJ SOLN
INTRAMUSCULAR | Status: DC | PRN
  Administered 2013-12-06: 0.2 mg via INTRAVENOUS
  Administered 2013-12-06: 0.6 mg via INTRAVENOUS

## 2013-12-06 MED ORDER — PROPOFOL 10 MG/ML IV BOLUS
INTRAVENOUS | Status: DC | PRN
  Administered 2013-12-06: 130 mg via INTRAVENOUS

## 2013-12-06 MED ORDER — ONDANSETRON HCL 4 MG/2ML IJ SOLN
INTRAMUSCULAR | Status: AC
  Filled 2013-12-06: qty 2

## 2013-12-06 MED ORDER — GLYCOPYRROLATE 0.2 MG/ML IJ SOLN
INTRAMUSCULAR | Status: AC
  Filled 2013-12-06: qty 1

## 2013-12-06 SURGICAL SUPPLY — 37 items
BAG HAMPER (MISCELLANEOUS) ×5 IMPLANT
BLADE 11 SAFETY STRL DISP (BLADE) ×5 IMPLANT
CLOTH BEACON ORANGE TIMEOUT ST (SAFETY) ×5 IMPLANT
COVER LIGHT HANDLE STERIS (MISCELLANEOUS) ×10 IMPLANT
DRAPE PROXIMA HALF (DRAPES) ×5 IMPLANT
ELECT REM PT RETURN 9FT ADLT (ELECTROSURGICAL) ×5
ELECTRODE REM PT RTRN 9FT ADLT (ELECTROSURGICAL) ×3 IMPLANT
GLOVE BIOGEL PI IND STRL 7.0 (GLOVE) ×9 IMPLANT
GLOVE BIOGEL PI IND STRL 8 (GLOVE) ×3 IMPLANT
GLOVE BIOGEL PI INDICATOR 7.0 (GLOVE) ×6
GLOVE BIOGEL PI INDICATOR 8 (GLOVE) ×2
GLOVE ECLIPSE 8.0 STRL XLNG CF (GLOVE) ×5 IMPLANT
GLOVE EXAM NITRILE PF LG BLUE (GLOVE) ×5 IMPLANT
GLOVE SS BIOGEL STRL SZ 6.5 (GLOVE) ×3 IMPLANT
GLOVE SUPERSENSE BIOGEL SZ 6.5 (GLOVE) ×2
GOWN STRL REUS W/TWL LRG LVL3 (GOWN DISPOSABLE) ×15 IMPLANT
INST SET LAPROSCOPIC GYN AP (KITS) ×5 IMPLANT
KIT ROOM TURNOVER APOR (KITS) ×5 IMPLANT
MANIFOLD NEPTUNE II (INSTRUMENTS) ×5 IMPLANT
NEEDLE HYPO 18GX1.5 BLUNT FILL (NEEDLE) ×5 IMPLANT
NEEDLE HYPO 25X1 1.5 SAFETY (NEEDLE) ×5 IMPLANT
NEEDLE INSUFFLATION 120MM (ENDOMECHANICALS) ×5 IMPLANT
NS IRRIG 1000ML POUR BTL (IV SOLUTION) ×5 IMPLANT
PACK PERI GYN (CUSTOM PROCEDURE TRAY) ×5 IMPLANT
PAD ARMBOARD 7.5X6 YLW CONV (MISCELLANEOUS) ×5 IMPLANT
SET BASIN LINEN APH (SET/KITS/TRAYS/PACK) ×5 IMPLANT
SOLUTION ANTI FOG 6CC (MISCELLANEOUS) ×5 IMPLANT
SPONGE GAUZE 2X2 8PLY STER LF (GAUZE/BANDAGES/DRESSINGS) ×2
SPONGE GAUZE 2X2 8PLY STRL LF (GAUZE/BANDAGES/DRESSINGS) ×8 IMPLANT
STAPLER VISISTAT 35W (STAPLE) ×5 IMPLANT
SUT VICRYL 0 UR6 27IN ABS (SUTURE) ×5 IMPLANT
SYR 20CC LL (SYRINGE) ×5 IMPLANT
SYRINGE 10CC LL (SYRINGE) ×5 IMPLANT
TAPE CLOTH SURG 4X10 WHT LF (GAUZE/BANDAGES/DRESSINGS) ×5 IMPLANT
TROCAR ENDO BLADELESS 11MM (ENDOMECHANICALS) ×5 IMPLANT
TUBING INSUFFLATION (TUBING) ×5 IMPLANT
WARMER LAPAROSCOPE (MISCELLANEOUS) ×5 IMPLANT

## 2013-12-06 NOTE — Discharge Instructions (Signed)
Laparoscopic Tubal Ligation °Care After °Refer to this sheet in the next few weeks. These instructions provide you with information on caring for yourself after your procedure. Your caregiver may also give you more specific instructions. Your treatment has been planned according to current medical practices, but problems sometimes occur. Call your caregiver if you have any problems or questions after your procedure. °HOME CARE INSTRUCTIONS  °· Rest the remainder of the day. °· Only take over-the-counter or prescription medicines for pain, discomfort, or fever as directed by your caregiver. Do not take aspirin. It can cause bleeding. °· Gradually resume daily activities, diet, rest, driving, and work. °· Avoid sexual intercourse for 2 weeks or as directed. °· Do not use tampons or douche. °· Do not drive while taking pain medicine. °· Do not lift anything over 5 pounds for 2 weeks or as directed. °· Only take showers, not baths, until you are seen by your caregiver. °· Change bandages (dressings) as directed. °· Take your temperature twice a day and record it. °· Try to have help for the first 7 to 10 days for your household needs. °· Return to your caregiver to get your stitches (sutures) removed and for follow-up visits as directed. °SEEK MEDICAL CARE IF:  °· You have redness, swelling, or increasing pain in a wound. °· You have drainage from a wound lasting longer than 1 day. °· Your pain is getting worse. °· You have a rash. °· You become dizzy or lightheaded. °· You have a reaction to your medicine. °· You need stronger medicine or a change in your pain medicine. °· You notice a bad smell coming from a wound or dressing. °· Your wound breaks open after the sutures have been removed. °· You are constipated. °SEEK IMMEDIATE MEDICAL CARE IF:  °· You faint. °· You have a fever. °· You have increasing abdominal pain. °· You have severe pain in your shoulders. °· You have bleeding or drainage from the suture sites or  vagina following surgery. °· You have shortness of breath or difficulty breathing. °· You have chest or leg pain. °· You have persistent nausea, vomiting, or diarrhea. °MAKE SURE YOU:  °· Understand these instructions. °· Watch your condition. °· Get help right away if you are not doing well or get worse. °Document Released: 01/09/2005 Document Revised: 12/22/2011 Document Reviewed: 10/03/2011 °[employer]® Patient Information ©2014 [employer]. ° °

## 2013-12-06 NOTE — Transfer of Care (Signed)
Immediate Anesthesia Transfer of Care Note  Patient: Beth Hansen  Procedure(s) Performed: Procedure(s): INTRAUTERINE DEVICE (IUD) REMOVAL (N/A) LAPAROSCOPIC BILATERAL TUBAL CAUTERIZATION USING ELECTROCAUTERY (Bilateral)  Patient Location: PACU  Anesthesia Type:General  Level of Consciousness: awake, alert , oriented and patient cooperative  Airway & Oxygen Therapy: Patient Spontanous Breathing and Patient connected to face mask oxygen  Post-op Assessment: Report given to PACU RN and Post -op Vital signs reviewed and stable  Post vital signs: Reviewed and stable  Complications: No apparent anesthesia complications

## 2013-12-06 NOTE — Progress Notes (Signed)
Informed pt IUD was saved for her as requested. Pt states "I don't want that. I don't even want to look at it." pt verbalized she did not want IUD to take home. IUD placed in needle box (sharps container) to be discarded.

## 2013-12-06 NOTE — Op Note (Signed)
Preoperative Diagnosis:  Multiparous female desires permanent sterilization  Postoperative Diagnosis:  Same as above  Procedure:  Laparoscopic Bilateral Tubal Ligation using electrocautery  Surgeon:  Jacelyn Grip MD  Anaesthesia:  LMA  Findings:  Patient had normal pelvic anatomy and no intraperitoneal abnormalities.  Description of Operation:  Patient was taken to the OR and placed into supine position where she underwent LMA anaesthesia.  She was placed in the dorsal lithotomy position and prepped and draped in the usual sterile fashion.  An incision was made in the umbilicus and dissection taken down to the rectus fascia which was incised and opened.  The non bladed trocar was then placed and the peritoneal cavity was insufflated.  The above noted findings were observed.  The Kleppinger electrocautery unit was employed and both tubes were burned to no resistance and beyond in the distal ishtmic and ampullary regions, approximately a 3.5 cm segment bilaterally.  There was good hemostasis.  The fascia, peritoneum and subcutaneous tissue were closed using 0 vicryl.  The skin was closed using staples.  The patient was awakened from anaesthesia and taken to the PACU with all counts being correct x 3.  The patient received Ancef 1 gram and Toradol 30 mg IV preoperatively.   Florian Buff 12/06/2013 11:07 AM

## 2013-12-06 NOTE — Anesthesia Postprocedure Evaluation (Signed)
  Anesthesia Post-op Note  Patient: Beth Hansen  Procedure(s) Performed: Procedure(s): INTRAUTERINE DEVICE (IUD) REMOVAL (N/A) LAPAROSCOPIC BILATERAL TUBAL CAUTERIZATION USING ELECTROCAUTERY (Bilateral)  Patient Location: PACU  Anesthesia Type:General  Level of Consciousness: awake, alert , oriented and patient cooperative  Airway and Oxygen Therapy: Patient Spontanous Breathing and Patient connected to face mask oxygen  Post-op Pain: mild  Post-op Assessment: Post-op Vital signs reviewed, Patient's Cardiovascular Status Stable, Respiratory Function Stable, Patent Airway, No signs of Nausea or vomiting and Pain level controlled  Post-op Vital Signs: Reviewed and stable  Last Vitals:  Filed Vitals:   12/06/13 1005  BP: 108/70  Temp:   Resp: 14    Complications: No apparent anesthesia complications

## 2013-12-06 NOTE — Anesthesia Procedure Notes (Signed)
Procedure Name: Intubation Date/Time: 12/06/2013 10:19 AM Performed by: Andree Elk, Wiletta Bermingham A Pre-anesthesia Checklist: Patient identified, Patient being monitored, Timeout performed, Emergency Drugs available and Suction available Patient Re-evaluated:Patient Re-evaluated prior to inductionOxygen Delivery Method: Circle System Utilized Preoxygenation: Pre-oxygenation with 100% oxygen Intubation Type: IV induction Ventilation: Mask ventilation without difficulty Laryngoscope Size: 3 and Miller Grade View: Grade I Tube type: Oral Tube size: 7.0 mm Number of attempts: 1 Airway Equipment and Method: stylet Placement Confirmation: ETT inserted through vocal cords under direct vision,  positive ETCO2 and breath sounds checked- equal and bilateral Secured at: 21 cm Tube secured with: Tape Dental Injury: Teeth and Oropharynx as per pre-operative assessment

## 2013-12-06 NOTE — Anesthesia Preprocedure Evaluation (Signed)
Anesthesia Evaluation  Patient identified by MRN, date of birth, ID band Patient awake    Reviewed: Allergy & Precautions, H&P , NPO status , Patient's Chart, lab work & pertinent test results  Airway Mallampati: II TM Distance: >3 FB Neck ROM: full    Dental  (+) Teeth Intact   Pulmonary  breath sounds clear to auscultation        Cardiovascular negative cardio ROS  Rhythm:regular Rate:Normal     Neuro/Psych    GI/Hepatic negative GI ROS,   Endo/Other    Renal/GU      Musculoskeletal   Abdominal   Peds  Hematology  (+) anemia ,   Anesthesia Other Findings       Reproductive/Obstetrics                           Anesthesia Physical Anesthesia Plan  ASA: II  Anesthesia Plan: General   Post-op Pain Management:    Induction: Intravenous  Airway Management Planned: Oral ETT  Additional Equipment:   Intra-op Plan:   Post-operative Plan: Extubation in OR  Informed Consent:   Plan Discussed with:   Anesthesia Plan Comments:         Anesthesia Quick Evaluation

## 2013-12-06 NOTE — H&P (Signed)
Preoperative History and Physical  Beth Hansen is a 28 y.o. [id number] with No LMP recorded. Patient is not currently having periods (Reason: IUD). admitted for a lapatroscopic bilateral tubal ligation using electrocautery. She desires permanent sterilization, she does not desire bilateral salpingectomy   PMH:    Past Medical History  Diagnosis Date  . Anemia     PSH:     Past Surgical History  Procedure Laterality Date  . Wisdom tooth extraction      POb/GynH:      OB History   Grav Para Term Preterm Abortions TAB SAB Ect Mult Living   2 2 2  0 0 0 0 0 0 2      SH:   History  Substance Use Topics  . Smoking status: Never Smoker   . Smokeless tobacco: Never Used  . Alcohol Use: No    FH:    Family History  Problem Relation Age of Onset  . Other Neg Hx   . Hypertension Father   . Diabetes Maternal Grandmother   . Hypertension Maternal Grandmother      Allergies: No Known Allergies  Medications:      Current facility-administered medications:bupivacaine liposome (EXPAREL) 1.3 % injection 266 mg, 20 mL, Infiltration, Once, Florian Buff, MD;  ceFAZolin (ANCEF) IVPB 2 g/50 mL premix, 2 g, Intravenous, On Call to OR, Florian Buff, MD;  ketorolac (TORADOL) 30 MG/ML injection 30 mg, 30 mg, Intravenous, Once, Florian Buff, MD lactated ringers infusion, , Intravenous, Continuous, Lerry Liner, MD, Last Rate: 75 mL/hr at 12/06/13 0916, 1,000 mL at 12/06/13 0916;  midazolam (VERSED) injection 1-2 mg, 1-2 mg, Intravenous, Q5 Min x 3 PRN, Lerry Liner, MD;  ondansetron [facility]) injection 4 mg, 4 mg, Intravenous, Once, Lerry Liner, MD Facility-Administered Medications Ordered in Other Encounters: lactated ringers infusion, , , Continuous PRN, Amy A Adams, CRNA  Review of Systems:   Review of Systems  Constitutional: Negative for fever, chills, weight loss, malaise/fatigue and diaphoresis.  HENT: Negative for hearing loss, ear pain, nosebleeds, congestion, sore throat,  neck pain, tinnitus and ear discharge.   Eyes: Negative for blurred vision, double vision, photophobia, pain, discharge and redness.  Respiratory: Negative for cough, hemoptysis, sputum production, shortness of breath, wheezing and stridor.   Cardiovascular: Negative for chest pain, palpitations, orthopnea, claudication, leg swelling and PND.  Gastrointestinal: negative for abdominal pain. Negative for heartburn, nausea, vomiting, diarrhea, constipation, blood in stool and melena.  Genitourinary: Negative for dysuria, urgency, frequency, hematuria and flank pain.  Musculoskeletal: Negative for myalgias, back pain, joint pain and falls.  Skin: Negative for itching and rash.  Neurological: Negative for dizziness, tingling, tremors, sensory change, speech change, focal weakness, seizures, loss of consciousness, weakness and headaches.  Endo/Heme/Allergies: Negative for environmental allergies and polydipsia. Does not bruise/bleed easily.  Psychiatric/Behavioral: Negative for depression, suicidal ideas, hallucinations, memory loss and substance abuse. The patient is not nervous/anxious and does not have insomnia.      PHYSICAL EXAM:  Blood pressure 118/69, temperature 98 F (36.7 C), resp. rate 22, SpO2 100.00%, not currently breastfeeding.    Vitals reviewed. Constitutional: She is oriented to person, place, and time. She appears well-developed and well-nourished.  HENT:  Head: Normocephalic and atraumatic.  Right Ear: External ear normal.  Left Ear: External ear normal.  Nose: Nose normal.  Mouth/Throat: Oropharynx is clear and moist.  Eyes: Conjunctivae and EOM are normal. Pupils are equal, round, and reactive to light. Right eye exhibits no discharge. Left eye  exhibits no discharge. No scleral icterus.  Neck: Normal range of motion. Neck supple. No tracheal deviation present. No thyromegaly present.  Cardiovascular: Normal rate, regular rhythm, normal heart sounds and intact distal  pulses.  Exam reveals no gallop and no friction rub.   No murmur heard. Respiratory: Effort normal and breath sounds normal. No respiratory distress. She has no wheezes. She has no rales. She exhibits no tenderness.  GI: Soft. Bowel sounds are normal. She exhibits no distension and no mass. There is tenderness. There is no rebound and no guarding.  Genitourinary:       Vulva is normal without lesions Vagina is pink moist without discharge Cervix normal in appearance and pap is normal Uterus is normal Adnexa is negative with normal sized ovaries by sonogram  Musculoskeletal: Normal range of motion. She exhibits no edema and no tenderness.  Neurological: She is alert and oriented to person, place, and time. She has normal reflexes. She displays normal reflexes. No cranial nerve deficit. She exhibits normal muscle tone. Coordination normal.  Skin: Skin is warm and dry. No rash noted. No erythema. No pallor.  Psychiatric: She has a normal mood and affect. Her behavior is normal. Judgment and thought content normal.    Labs: Results for orders placed during the hospital encounter of 11/29/13 (from the past 336 hour(s))  URINALYSIS, ROUTINE W REFLEX MICROSCOPIC   Collection Time    11/29/13  3:02 PM      Result Value Ref Range   Color, Urine YELLOW  YELLOW   APPearance HAZY (*) CLEAR   Specific Gravity, Urine >1.030 (*) 1.005 - 1.030   pH 6.0  5.0 - 8.0   Glucose, UA NEGATIVE  NEGATIVE mg/dL   Hgb urine dipstick NEGATIVE  NEGATIVE   Bilirubin Urine NEGATIVE  NEGATIVE   Ketones, ur NEGATIVE  NEGATIVE mg/dL   Protein, ur NEGATIVE  NEGATIVE mg/dL   Urobilinogen, UA 0.2  0.0 - 1.0 mg/dL   Nitrite POSITIVE (*) NEGATIVE   Leukocytes, UA NEGATIVE  NEGATIVE  URINE MICROSCOPIC-ADD ON   Collection Time    11/29/13  3:02 PM      Result Value Ref Range   Squamous Epithelial / LPF MANY (*) RARE   WBC, UA 3-6  <3 WBC/hpf   Bacteria, UA MANY (*) RARE  CBC   Collection Time    11/29/13  3:20 PM       Result Value Ref Range   WBC 3.8 (*) 4.0 - 10.5 K/uL   RBC 4.45  3.87 - 5.11 MIL/uL   Hemoglobin 12.0  12.0 - 15.0 g/dL   HCT 36.9  36.0 - 46.0 %   MCV 82.9  78.0 - 100.0 fL   MCH 27.0  26.0 - 34.0 pg   MCHC 32.5  30.0 - 36.0 g/dL   RDW 12.7  11.5 - 15.5 %   Platelets 246  150 - 400 K/uL  COMPREHENSIVE METABOLIC PANEL   Collection Time    11/29/13  3:20 PM      Result Value Ref Range   Sodium 140  137 - 147 mEq/L   Potassium 4.4  3.7 - 5.3 mEq/L   Chloride 103  96 - 112 mEq/L   CO2 29  19 - 32 mEq/L   Glucose, Bld 84  70 - 99 mg/dL   BUN 7  6 - 23 mg/dL   Creatinine, Ser 0.74  0.50 - 1.10 mg/dL   Calcium 9.1  8.4 - 10.5 mg/dL  Total Protein 7.8  6.0 - 8.3 g/dL   Albumin 3.7  3.5 - 5.2 g/dL   AST 13  0 - 37 U/L   ALT 7  0 - 35 U/L   Alkaline Phosphatase 61  39 - 117 U/L   Total Bilirubin 0.6  0.3 - 1.2 mg/dL   GFR calc non Af Amer >90  >90 mL/min   GFR calc Af Amer >90  >90 mL/min  HCG, QUANTITATIVE, PREGNANCY   Collection Time    11/29/13  3:20 PM      Result Value Ref Range   hCG, Beta Chain, Quant, S <1  <5 mIU/mL    EKG: No orders found for this or any previous visit.  Imaging Studies: No results found.    Assessment: Desires permanent sterizliation Patient Active Problem List   Diagnosis Date Noted  . Spontaneous vaginal delivery 12/27/2011  . Supervision of normal pregnancy 12/26/2011  . Rh negative state in antepartum period 12/26/2011  . Group B streptococcal infection 12/26/2011    Plan: Laparoscopic bilateral tubal ligation using electrocautery  Mertie Clause Molina Hollenback 12/06/2013 9:45 AM

## 2013-12-07 ENCOUNTER — Encounter (HOSPITAL_COMMUNITY): Payer: Self-pay | Admitting: Obstetrics & Gynecology

## 2013-12-07 ENCOUNTER — Telehealth: Payer: Self-pay

## 2013-12-07 NOTE — Telephone Encounter (Signed)
Spoke with pt. Pt had surgery yesterday. Pt states she is bleeding, having to change pad often. Passing some clots. + cramping. I spoke with Dr. [name]. He advised everything sounds ok at this time. He expects pt to have bleeding. Take pain med as directed. Pt has a follow up scheduled for next Wednesday. Advised to take it easy and call sooner with any concerns. Pt voiced understanding. [place]

## 2013-12-13 ENCOUNTER — Encounter: Payer: Self-pay | Admitting: Obstetrics & Gynecology

## 2013-12-13 ENCOUNTER — Ambulatory Visit (INDEPENDENT_AMBULATORY_CARE_PROVIDER_SITE_OTHER): Payer: Medicaid Other | Admitting: Obstetrics & Gynecology

## 2013-12-13 VITALS — BP 110/80 | Ht 69.0 in | Wt 188.0 lb

## 2013-12-13 DIAGNOSIS — D239 Other benign neoplasm of skin, unspecified: Secondary | ICD-10-CM

## 2013-12-13 DIAGNOSIS — D229 Melanocytic nevi, unspecified: Secondary | ICD-10-CM

## 2013-12-13 NOTE — Addendum Note (Signed)
Addended by: Farley Ly on: 12/13/2013 04:17 PM   Modules accepted: Orders

## 2013-12-13 NOTE — Progress Notes (Signed)
Patient ID: Beth Hansen, female   DOB: [birth year], 28 y.o.   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

## 2013-12-25 ENCOUNTER — Ambulatory Visit (INDEPENDENT_AMBULATORY_CARE_PROVIDER_SITE_OTHER): Payer: Medicaid Other | Admitting: Obstetrics & Gynecology

## 2013-12-25 ENCOUNTER — Encounter: Payer: Self-pay | Admitting: Obstetrics & Gynecology

## 2013-12-25 VITALS — BP 108/60 | Wt 184.0 lb

## 2013-12-25 DIAGNOSIS — Z4802 Encounter for removal of sutures: Secondary | ICD-10-CM

## 2013-12-25 NOTE — Progress Notes (Signed)
Patient ID: Beth Hansen, female   DOB: 1985-07-10, 28 y.o.   MRN: [id number] Blood pressure 108/60, weight 184 lb (83.462 kg), last menstrual period 11/17/2013, not currently breastfeeding.  Beth Hansen in for wound evaluation and removal of sutures of left shoulder and breast due to junctional nevus No complaints  ROS No burning with urination, frequency or urgency No nausea, vomiting or diarrhea Nor fever chills or other constitutional symptoms  Exam Both incisions look good healing well  Sutures removed  Continue to keep neosporin on them for 2 weeks Follow up prn

## 2014-01-22 ENCOUNTER — Encounter: Payer: Self-pay | Admitting: Obstetrics & Gynecology

## 2014-01-22 ENCOUNTER — Ambulatory Visit (INDEPENDENT_AMBULATORY_CARE_PROVIDER_SITE_OTHER): Payer: Medicaid Other | Admitting: Obstetrics & Gynecology

## 2014-01-22 VITALS — BP 104/70 | Wt 177.0 lb

## 2014-01-22 DIAGNOSIS — N898 Other specified noninflammatory disorders of vagina: Secondary | ICD-10-CM

## 2014-01-22 DIAGNOSIS — Z7251 High risk heterosexual behavior: Secondary | ICD-10-CM

## 2014-01-22 MED ORDER — METRONIDAZOLE 500 MG PO TABS: 500.0000 mg | ORAL_TABLET | Freq: Two times a day (BID) | ORAL | Status: DC

## 2014-01-22 NOTE — Progress Notes (Signed)
Patient ID: Beth Hansen, female   DOB: August 24, 1985, 28 y.o.   MRN: [id number] Chief Complaint  Patient presents with  . GYN VISIT    STD Screening. C/C VAGINAL DISCHARGES.    HPI Discharge for 3 weeks odor, none +itching Used monistat 7 without success  ROS No burning with urination, frequency or urgency No nausea, vomiting or diarrhea Nor fever chills or other constitutional symptoms   Blood pressure 104/70, weight 177 lb (80.287 kg), last menstrual period 12/07/2013.  EXAM Abdomen:       Vulva:            Discharge evident Vagina:          normal mucosa, thin grey discharge Cervix:           normal appearance Uterus:           Adnexa:          Rectal:            Hemoccult:                            Wet prep:  + BV                     No yeast                    No trich   Assessment/Plan:  BV:  Metronidazole 500 BID x 7days

## 2014-05-07 ENCOUNTER — Encounter: Payer: Self-pay | Admitting: Obstetrics & Gynecology

## 2015-12-31 ENCOUNTER — Other Ambulatory Visit: Payer: Medicaid Other | Admitting: Obstetrics and Gynecology

## 2016-02-18 ENCOUNTER — Encounter: Payer: Self-pay | Admitting: Obstetrics and Gynecology

## 2016-02-18 ENCOUNTER — Other Ambulatory Visit (HOSPITAL_COMMUNITY)
Admission: RE | Admit: 2016-02-18 | Discharge: 2016-02-18 | Disposition: A | Discharge status: Home / Self Care | Payer: 59 | Source: Ambulatory Visit | Attending: Obstetrics and Gynecology | Admitting: Obstetrics and Gynecology

## 2016-02-18 ENCOUNTER — Ambulatory Visit (INDEPENDENT_AMBULATORY_CARE_PROVIDER_SITE_OTHER): Payer: 59 | Admitting: Obstetrics and Gynecology

## 2016-02-18 VITALS — BP 120/70 | Ht 69.0 in | Wt 184.0 lb

## 2016-02-18 DIAGNOSIS — Z01419 Encounter for gynecological examination (general) (routine) without abnormal findings: Secondary | ICD-10-CM | POA: Insufficient documentation

## 2016-02-18 DIAGNOSIS — Z1151 Encounter for screening for human papillomavirus (HPV): Secondary | ICD-10-CM | POA: Insufficient documentation

## 2016-02-18 DIAGNOSIS — E663 Overweight: Secondary | ICD-10-CM

## 2016-02-18 NOTE — Progress Notes (Signed)
  Assessment:  Annual Gyn Exam  Overweight, requesting weight control advice Plan:  1. pap smear done, next pap due In 3 years 2. return annually or prn 3    Annual mammogram advised 4. Discussed weight loss strategies including calorie count strategies, FOR weight recording and exercise recording Subjective:  Beth Hansen is a 30 y.o. female [phone] who presents for annual exam. No LMP recorded. The patient has complaints today of Overweight, needing to lose weight for health insurance  The following portions of the patient's history were reviewed and updated as appropriate: allergies, current medications, past family history, past medical history, past social history, past surgical history and problem list. Past Medical History:  Diagnosis Date  . Anemia     Past Surgical History:  Procedure Laterality Date  . IUD REMOVAL N/A 12/06/2013   Procedure: INTRAUTERINE DEVICE (IUD) REMOVAL;  Surgeon: Florian Buff, MD;  Location: [facility];  Service: Gynecology;  Laterality: N/A;  . LAPAROSCOPIC TUBAL LIGATION Bilateral 12/06/2013   Procedure: LAPAROSCOPIC BILATERAL TUBAL CAUTERIZATION USING ELECTROCAUTERY;  Surgeon: Florian Buff, MD;  Location: [facility];  Service: Gynecology;  Laterality: Bilateral;  . WISDOM TOOTH EXTRACTION      No current outpatient prescriptions on file.  Review of Systems Constitutional: positive for BMI 27 Gastrointestinal: negative Genitourinary: Negative  Objective:  BP 120/70 (BP Location: Right Arm, Patient Position: Sitting, Cuff Size: Normal)   Ht 5\' 9"  (1.753 m)   Wt 184 lb (83.5 kg)   BMI 27.17 kg/m    BMI: Body mass index is 27.17 kg/m.  General Appearance: Alert, appropriate appearance for age. No acute distress HEENT: Grossly normal Neck / Thyroid:  Cardiovascular: RRR; normal S1, S2, no murmur Lungs: CTA bilaterally Back: No CVAT Breast Exam: No dimpling, nipple retraction or discharge. No masses or nodes., Normal breast tissue bilaterally  and No masses or nodes.No dimpling, nipple retraction or discharge. Gastrointestinal: Soft, non-tender, no masses or organomegaly Pelvic Exam: Exam deferred. Cervix: normal appearance Adnexa: normal bimanual exam Uterus: normal single, nontender Rectal exam not indicated Rectovaginal: not indicated Lymphatic Exam: Non-palpable nodes in neck, clavicular, axillary, or inguinal regions Skin: no rash or abnormalities Neurologic: Normal gait and speech, no tremor  Psychiatric: Alert and oriented, appropriate affect.  Urinalysis:Not done  Mallory Shirk. MD Pgr [phone] 5:47 PM

## 2016-02-21 LAB — CYTOLOGY - PAP

## 2016-02-24 ENCOUNTER — Telehealth: Payer: Self-pay | Admitting: *Deleted

## 2016-02-24 NOTE — Telephone Encounter (Signed)
Pt informed of +HPV on 02/18/2016, all questions answered and pt to repeat pap in 1 year. Pt requesting more information in regards to results, +HPV pamphlet mailed to pt.

## 2016-08-17 ENCOUNTER — Encounter: Payer: Self-pay | Admitting: Obstetrics and Gynecology

## 2016-08-17 ENCOUNTER — Ambulatory Visit (INDEPENDENT_AMBULATORY_CARE_PROVIDER_SITE_OTHER): Payer: BC Managed Care – PPO | Admitting: Obstetrics and Gynecology

## 2016-08-17 VITALS — BP 112/70 | HR 56 | Wt 174.6 lb

## 2016-08-17 DIAGNOSIS — Z113 Encounter for screening for infections with a predominantly sexual mode of transmission: Secondary | ICD-10-CM | POA: Diagnosis not present

## 2016-08-17 DIAGNOSIS — N898 Other specified noninflammatory disorders of vagina: Secondary | ICD-10-CM | POA: Diagnosis not present

## 2016-08-17 MED ORDER — METRONIDAZOLE 500 MG PO TABS: 500.0000 mg | ORAL_TABLET | Freq: Two times a day (BID) | ORAL | 0 refills | Status: DC

## 2016-08-17 NOTE — Progress Notes (Signed)
Patient ID: [name], female   DOB: 1985-10-12, 31 y.o.   MRN: [id number]   [facility] Visit  @DATE @            Patient name: Beth Hansen MRN [id number]  Date of birth: 07/24/1985  CC & HPI:   Chief Complaint  Patient presents with   Gynecologic Exam    [facility]     [place] B Poert[place] is a 31 y.o. female presenting today for STD testing. Per pt, her husband has been sexually active with other partners, so she would like to be checked. Pt reports she has noticed malodorous vaginal discharge and vulvar itching over the last week. She states her itching is worsened with scratching and there are no alleviating factors noted. She denies vaginal or pelvic pain or discomfort.   ROS:  ROS +malodorous vaginal discharge, vulvar itching  No vaginal/pelvic pain  Pertinent History Reviewed:   Reviewed: Significant for anemia  Medical         Past Medical History:  Diagnosis Date   Anemia                               Surgical Hx:    Past Surgical History:  Procedure Laterality Date   IUD REMOVAL N/A 12/06/2013   Procedure: INTRAUTERINE DEVICE (IUD) REMOVAL;  Surgeon: Florian Buff, MD;  Location: [facility];  Service: Gynecology;  Laterality: N/A;   LAPAROSCOPIC TUBAL LIGATION Bilateral 12/06/2013   Procedure: LAPAROSCOPIC BILATERAL TUBAL CAUTERIZATION USING ELECTROCAUTERY;  Surgeon: Florian Buff, MD;  Location: [facility];  Service: Gynecology;  Laterality: Bilateral;   WISDOM TOOTH EXTRACTION     Medications: Reviewed & Updated - see associated section                      No current outpatient prescriptions on file.   Social History: Reviewed -  reports that she has never smoked. She has never used smokeless tobacco.  Objective Findings:  Vitals: Blood pressure 112/70, pulse (!) 56, weight 174 lb 9.6 oz (79.2 kg), last menstrual period 08/10/2016.  Physical Examination: General appearance - alert, well appearing, and in no distress Mental status - alert, oriented to  person, place, and time Pelvic - VULVA: normal appearing vulva with no masses, tenderness or lesions,  VAGINA: normal appearing vagina with normal color and discharge, no lesions, normal appearing secretions  CERVIX: normal appearing cervix without discharge or lesions,   GC/CHL collected  Wet prep collected: Positive for BV, negative for trichomoniasis  KOH collected: negative   Assessment & Plan:   A:  1. Encounter for STD testing  2. Haemophilus vaginalis   P:  1. Order HIV/RPR =negative 2. Rx Flagyl for BV    By signing my name below, I, Hansel Feinstein, attest that this documentation has been prepared under the direction and in the presence of Jonnie Kind, MD. Electronically Signed: Hansel Feinstein, ED Scribe. 08/17/16. 1:58 PM.  I personally performed the services described in this documentation, which was SCRIBED in my presence. The recorded information has been reviewed and considered accurate. It has been edited as necessary during review. Jonnie Kind, MD

## 2016-08-17 NOTE — Patient Instructions (Addendum)
You have an overgrowth of bacteria in your vagina. This is not a sexually transmitted infection.  Sexual partners do not need to be treated, however abstaining from sex or using condoms may prevent recurrence of the overgrowth. Some women have a recurrence of the overgrowth even when fully treated.  Call the office if your symptoms begin again.  Do not douche.  This is associated with decreased cure rates and more bacterial overgrowths. Take the full course of the antibiotic prescribed to you even if you begin to feel better. Do not drink alcohol with flagyl as the drug will cause an upset stomach if you do drink.

## 2016-08-18 LAB — HIV ANTIBODY (ROUTINE TESTING W REFLEX): HIV Screen 4th Generation wRfx: NONREACTIVE

## 2016-08-18 LAB — RPR: RPR Ser Ql: NONREACTIVE

## 2016-08-19 LAB — GC/CHLAMYDIA PROBE AMP
Chlamydia trachomatis, NAA: NEGATIVE
Neisseria gonorrhoeae by PCR: NEGATIVE

## 2016-08-20 ENCOUNTER — Telehealth: Payer: Self-pay | Admitting: *Deleted

## 2016-08-20 DIAGNOSIS — Z113 Encounter for screening for infections with a predominantly sexual mode of transmission: Secondary | ICD-10-CM | POA: Insufficient documentation

## 2016-08-20 NOTE — Telephone Encounter (Signed)
Patient called wanting results of blood work from 2/12. Informed patient all labs were negative. Pt verbalized understanding.

## 2016-12-16 ENCOUNTER — Ambulatory Visit (INDEPENDENT_AMBULATORY_CARE_PROVIDER_SITE_OTHER): Payer: BC Managed Care – PPO | Admitting: Obstetrics and Gynecology

## 2016-12-16 VITALS — BP 118/66 | HR 68 | Wt 174.0 lb

## 2016-12-16 DIAGNOSIS — N898 Other specified noninflammatory disorders of vagina: Secondary | ICD-10-CM | POA: Diagnosis not present

## 2016-12-16 DIAGNOSIS — Z113 Encounter for screening for infections with a predominantly sexual mode of transmission: Secondary | ICD-10-CM

## 2016-12-16 DIAGNOSIS — R300 Dysuria: Secondary | ICD-10-CM | POA: Diagnosis not present

## 2016-12-16 LAB — POCT WET PREP (WET MOUNT)

## 2016-12-16 LAB — POCT URINALYSIS DIPSTICK
Blood, UA: NEGATIVE
Glucose, UA: NEGATIVE
KETONES UA: NEGATIVE
LEUKOCYTES UA: NEGATIVE
Nitrite, UA: NEGATIVE
PROTEIN UA: NEGATIVE

## 2016-12-16 MED ORDER — NYSTATIN-TRIAMCINOLONE 100000-0.1 UNIT/GM-% EX OINT: 1.0000 "application " | TOPICAL_OINTMENT | Freq: Two times a day (BID) | CUTANEOUS | 99 refills | Status: DC

## 2016-12-16 NOTE — Progress Notes (Addendum)
Patient ID: Beth Hansen, female   DOB: 15-Jan-1986, 31 y.o.   MRN: [id number]   [facility] Visit  @DATE @            Patient name: Beth Hansen MRN [id number]  Date of birth: Dec 03, 1985  CC & HPI:   Chief Complaint  Patient presents with  . possible uti    Beth Hansen is a 31 y.o. female presenting today for urinary symptoms and abnormal vaginal discharge. Pt states her symptoms began 3 days ago with lower abdominal pain, dysuria and frequency. She reports she then developed vaginal itching and clumpy vaginal d/c yesterday. Pt states she became concerned when her partner stated he was having some penile itching. She was recently prescribed a steroid taper and diclofenac for knee issues, but has not taken any antibiotics recently.   ROS:  ROS  +abdominal pain  +dysuria, frequency  +vaginal d/c and itching   Pertinent History Reviewed:   Reviewed: Significant for tubal ligation  Medical         Past Medical History:  Diagnosis Date  . Anemia                               Surgical Hx:    Past Surgical History:  Procedure Laterality Date  . IUD REMOVAL N/A 12/06/2013   Procedure: INTRAUTERINE DEVICE (IUD) REMOVAL;  Surgeon: Florian Buff, MD;  Location: [facility];  Service: Gynecology;  Laterality: N/A;  . LAPAROSCOPIC TUBAL LIGATION Bilateral 12/06/2013   Procedure: LAPAROSCOPIC BILATERAL TUBAL CAUTERIZATION USING ELECTROCAUTERY;  Surgeon: Florian Buff, MD;  Location: [facility];  Service: Gynecology;  Laterality: Bilateral;  . WISDOM TOOTH EXTRACTION     Medications: Reviewed & Updated - see associated section                       Current Outpatient Prescriptions:  .  metroNIDAZOLE (FLAGYL) 500 MG tablet, Take 1 tablet (500 mg total) by mouth 2 (two) times daily. (Patient not taking: Reported on 12/16/2016), Disp: 14 tablet, Rfl: 0   Social History: Reviewed -  reports that she has never smoked. She has never used smokeless tobacco.  Objective Findings:  Vitals:  Blood pressure 118/66, pulse 68, weight 174 lb (78.9 kg), last menstrual period 11/24/2016.  Physical Examination: General appearance - alert, well appearing, and in no distress Mental status - alert, oriented to person, place, and time Abdomen - soft, nontender, nondistended, no masses or organomegaly Pelvic -  VULVA: normal appearing vulva with no masses, tenderness or lesions  VAGINA: normal appearing vagina with normal color, no lesions. Clumpy vaginal discharge at the entrance, normal appearing secretions in the vaginal vault. Vaginal sidewall non-tender.  CERVIX: normal appearing cervix without discharge or lesions,  UTERUS: uterus is normal size, shape, consistency and nontender ADNEXA: normal adnexa in size, nontender and no masses  Wet prep: Mainly epithelial's present. No yeast seen. Positive WBCs.   KOH: negative  GC/CHL collected  UA negative   Assessment & Plan:   A:  1. Vulvar irritation, no identifiable yeast, likely post-intimacy irritation    P:  1. Mytrex for symptomatic relief  2. F/u PRN or for annual exam in 1 year    By signing my name below, I, Hansel Feinstein, attest that this documentation has been prepared under the direction and in the presence of Jonnie Kind, MD. Electronically Signed: Hansel Feinstein,  ED Scribe. 12/16/16. 9:27 AM.  I personally performed the services described in this documentation, which was SCRIBED in my presence. The recorded information has been reviewed and considered accurate. It has been edited as necessary during review. Jonnie Kind, MD

## 2016-12-18 LAB — GC/CHLAMYDIA PROBE AMP
Chlamydia trachomatis, NAA: NEGATIVE
Neisseria gonorrhoeae by PCR: NEGATIVE

## 2018-07-03 ENCOUNTER — Other Ambulatory Visit: Payer: Self-pay

## 2018-07-03 ENCOUNTER — Emergency Department (HOSPITAL_COMMUNITY)
Admission: EM | Admit: 2018-07-03 | Discharge: 2018-07-03 | Disposition: A | Discharge status: Home / Self Care | Payer: No Typology Code available for payment source | Attending: Emergency Medicine | Admitting: Emergency Medicine

## 2018-07-03 ENCOUNTER — Encounter (HOSPITAL_COMMUNITY): Payer: Self-pay

## 2018-07-03 ENCOUNTER — Emergency Department (HOSPITAL_COMMUNITY): Payer: No Typology Code available for payment source

## 2018-07-03 DIAGNOSIS — Y999 Unspecified external cause status: Secondary | ICD-10-CM | POA: Insufficient documentation

## 2018-07-03 DIAGNOSIS — Y92411 Interstate highway as the place of occurrence of the external cause: Secondary | ICD-10-CM | POA: Insufficient documentation

## 2018-07-03 DIAGNOSIS — S3992XA Unspecified injury of lower back, initial encounter: Secondary | ICD-10-CM | POA: Diagnosis present

## 2018-07-03 DIAGNOSIS — S39012A Strain of muscle, fascia and tendon of lower back, initial encounter: Secondary | ICD-10-CM | POA: Diagnosis not present

## 2018-07-03 DIAGNOSIS — Y9389 Activity, other specified: Secondary | ICD-10-CM | POA: Diagnosis not present

## 2018-07-03 MED ORDER — CYCLOBENZAPRINE HCL 5 MG PO TABS: 5.0000 mg | ORAL_TABLET | Freq: Three times a day (TID) | ORAL | 0 refills | Status: DC | PRN

## 2018-07-03 MED ORDER — IBUPROFEN 600 MG PO TABS: 600.0000 mg | ORAL_TABLET | Freq: Four times a day (QID) | ORAL | 0 refills | Status: DC | PRN
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
## 2018-10-19 ENCOUNTER — Other Ambulatory Visit: Payer: BC Managed Care – PPO | Admitting: Obstetrics and Gynecology

## 2018-12-09 ENCOUNTER — Encounter: Payer: Self-pay | Admitting: *Deleted

## 2018-12-12 ENCOUNTER — Ambulatory Visit (INDEPENDENT_AMBULATORY_CARE_PROVIDER_SITE_OTHER): Payer: BC Managed Care – PPO | Admitting: Obstetrics & Gynecology

## 2018-12-12 ENCOUNTER — Other Ambulatory Visit (HOSPITAL_COMMUNITY)
Admission: RE | Admit: 2018-12-12 | Discharge: 2018-12-12 | Disposition: A | Discharge status: Home / Self Care | Payer: BC Managed Care – PPO | Source: Ambulatory Visit | Attending: Obstetrics and Gynecology | Admitting: Obstetrics and Gynecology

## 2018-12-12 ENCOUNTER — Encounter: Payer: Self-pay | Admitting: Obstetrics & Gynecology

## 2018-12-12 ENCOUNTER — Other Ambulatory Visit: Payer: Self-pay

## 2018-12-12 ENCOUNTER — Other Ambulatory Visit: Payer: BC Managed Care – PPO | Admitting: Obstetrics and Gynecology

## 2018-12-12 VITALS — BP 98/62 | HR 77 | Ht 69.0 in | Wt 198.4 lb

## 2018-12-12 DIAGNOSIS — Z01419 Encounter for gynecological examination (general) (routine) without abnormal findings: Secondary | ICD-10-CM | POA: Insufficient documentation

## 2018-12-12 NOTE — Addendum Note (Signed)
Addended by: Diona Fanti A on: 12/12/2018 11:56 AM   Modules accepted: Orders

## 2018-12-12 NOTE — Progress Notes (Signed)
Subjective:     Beth Hansen is a 33 y.o. female here for a routine exam.  Patient's last menstrual period was 11/21/2018. [id number] Birth Control Method:  BTL Menstrual Calendar(currently): 2015  Current complaints: .   Current acute medical issues:  Desires tubal reanastomosis, pt given Dr Charlett Lango phone number   Recent Gynecologic History Patient's last menstrual period was 11/21/2018. Last Pap: 2015,  HPV positive negative cytology Last mammogram: ,    Past Medical History:  Diagnosis Date  . Anemia     Past Surgical History:  Procedure Laterality Date  . IUD REMOVAL N/A 12/06/2013   Procedure: INTRAUTERINE DEVICE (IUD) REMOVAL;  Surgeon: Florian Buff, MD;  Location: [facility];  Service: Gynecology;  Laterality: N/A;  . LAPAROSCOPIC TUBAL LIGATION Bilateral 12/06/2013   Procedure: LAPAROSCOPIC BILATERAL TUBAL CAUTERIZATION USING ELECTROCAUTERY;  Surgeon: Florian Buff, MD;  Location: [facility];  Service: Gynecology;  Laterality: Bilateral;  . WISDOM TOOTH EXTRACTION      OB History    Gravida  2   Para  2   Term  2   Preterm  0   AB  0   Living  2     SAB  0   TAB  0   Ectopic  0   Multiple  0   Live Births  2           Social History   Socioeconomic History  . Marital status: Married    Spouse name: Not on file  . Number of children: 2  . Years of education: Not on file  . Highest education level: Not on file  Occupational History  . Not on file  Social Needs  . Financial resource strain: Not on file  . Food insecurity:    Worry: Not on file    Inability: Not on file  . Transportation needs:    Medical: Not on file    Non-medical: Not on file  Tobacco Use  . Smoking status: Never Smoker  . Smokeless tobacco: Never Used  Substance and Sexual Activity  . Alcohol use: No  . Drug use: No  . Sexual activity: Yes    Birth control/protection: Surgical  Lifestyle  . Physical activity:    Days per week: Not on file    Minutes per session:  Not on file  . Stress: Not on file  Relationships  . Social connections:    Talks on phone: Not on file    Gets together: Not on file    Attends religious service: Not on file    Active member of club or organization: Not on file    Attends meetings of clubs or organizations: Not on file    Relationship status: Not on file  Other Topics Concern  . Not on file  Social History Narrative  . Not on file    Family History  Problem Relation Age of Onset  . Hypertension Father   . Diabetes Maternal Grandmother   . Hypertension Maternal Grandmother   . Other Neg Hx      Current Outpatient Medications:  .  cyclobenzaprine (FLEXERIL) 5 MG tablet, Take 1 tablet (5 mg total) by mouth 3 (three) times daily as needed for muscle spasms. (Patient not taking: Reported on 12/12/2018), Disp: 15 tablet, Rfl: 0 .  ibuprofen (ADVIL,MOTRIN) 600 MG tablet, Take 1 tablet (600 mg total) by mouth every 6 (six) hours as needed. (Patient not taking: Reported on 12/12/2018), Disp: 30 tablet, Rfl: 0 .  metroNIDAZOLE (FLAGYL) 500 MG tablet, Take 1 tablet (500 mg total) by mouth 2 (two) times daily. (Patient not taking: Reported on 12/16/2016), Disp: 14 tablet, Rfl: 0 .  nystatin-triamcinolone ointment (MYCOLOG), Apply 1 application topically 2 (two) times daily. To affected area., Disp: 30 g, Rfl: prn  Review of Systems  Review of Systems  Constitutional: Negative for fever, chills, weight loss, malaise/fatigue and diaphoresis.  HENT: Negative for hearing loss, ear pain, nosebleeds, congestion, sore throat, neck pain, tinnitus and ear discharge.   Eyes: Negative for blurred vision, double vision, photophobia, pain, discharge and redness.  Respiratory: Negative for cough, hemoptysis, sputum production, shortness of breath, wheezing and stridor.   Cardiovascular: Negative for chest pain, palpitations, orthopnea, claudication, leg swelling and PND.  Gastrointestinal: negative for abdominal pain. Negative for  heartburn, nausea, vomiting, diarrhea, constipation, blood in stool and melena.  Genitourinary: Negative for dysuria, urgency, frequency, hematuria and flank pain.  Musculoskeletal: Negative for myalgias, back pain, joint pain and falls.  Skin: Negative for itching and rash.  Neurological: Negative for dizziness, tingling, tremors, sensory change, speech change, focal weakness, seizures, loss of consciousness, weakness and headaches.  Endo/Heme/Allergies: Negative for environmental allergies and polydipsia. Does not bruise/bleed easily.  Psychiatric/Behavioral: Negative for depression, suicidal ideas, hallucinations, memory loss and substance abuse. The patient is not nervous/anxious and does not have insomnia.        Objective:  Blood pressure 98/62, pulse 77, height 5\' 9"  (1.753 m), weight 198 lb 6.4 oz (90 kg), last menstrual period 11/21/2018.   Physical Exam  Vitals reviewed. Constitutional: She is oriented to person, place, and time. She appears well-developed and well-nourished.  HENT:  Head: Normocephalic and atraumatic.        Right Ear: External ear normal.  Left Ear: External ear normal.  Nose: Nose normal.  Mouth/Throat: Oropharynx is clear and moist.  Eyes: Conjunctivae and EOM are normal. Pupils are equal, round, and reactive to light. Right eye exhibits no discharge. Left eye exhibits no discharge. No scleral icterus.  Neck: Normal range of motion. Neck supple. No tracheal deviation present. No thyromegaly present.  Cardiovascular: Normal rate, regular rhythm, normal heart sounds and intact distal pulses.  Exam reveals no gallop and no friction rub.   No murmur heard. Respiratory: Effort normal and breath sounds normal. No respiratory distress. She has no wheezes. She has no rales. She exhibits no tenderness.  GI: Soft. Bowel sounds are normal. She exhibits no distension and no mass. There is no tenderness. There is no rebound and no guarding.  Genitourinary:  Breasts no  masses skin changes or nipple changes bilaterally      Vulva is normal without lesions Vagina is pink moist without discharge Cervix normal in appearance and pap is done Uterus is normal size shape and contour Adnexa is negative with normal sized ovaries   Musculoskeletal: Normal range of motion. She exhibits no edema and no tenderness.  Neurological: She is alert and oriented to person, place, and time. She has normal reflexes. She displays normal reflexes. No cranial nerve deficit. She exhibits normal muscle tone. Coordination normal.  Skin: Skin is warm and dry. No rash noted. No erythema. No pallor.  Psychiatric: She has a normal mood and affect. Her behavior is normal. Judgment and thought content normal.       Medications Ordered at today's visit: No orders of the defined types were placed in this encounter.   Other orders placed at today's visit: No orders of the defined types were  placed in this encounter.     Assessment:    Healthy female exam.    Plan:    Contraception: tubal ligation. Follow up in: 2 weeks.     Return in about 2 years (around 12/11/2020) for yearly.

## 2018-12-13 LAB — CYTOLOGY - PAP
Diagnosis: NEGATIVE
HPV: NOT DETECTED

## 2019-01-25 ENCOUNTER — Other Ambulatory Visit: Payer: Medicaid Other

## 2019-01-25 ENCOUNTER — Other Ambulatory Visit: Payer: Self-pay

## 2019-01-25 DIAGNOSIS — Z20822 Contact with and (suspected) exposure to covid-19: Secondary | ICD-10-CM

## 2019-01-27 LAB — NOVEL CORONAVIRUS, NAA: SARS-CoV-2, NAA: NOT DETECTED

## 2019-04-04 ENCOUNTER — Other Ambulatory Visit: Payer: Self-pay

## 2019-04-04 DIAGNOSIS — Z20822 Contact with and (suspected) exposure to covid-19: Secondary | ICD-10-CM

## 2019-04-05 LAB — NOVEL CORONAVIRUS, NAA: SARS-CoV-2, NAA: NOT DETECTED

## 2019-05-05 ENCOUNTER — Other Ambulatory Visit: Payer: Self-pay

## 2019-05-05 DIAGNOSIS — Z20822 Contact with and (suspected) exposure to covid-19: Secondary | ICD-10-CM

## 2019-05-06 LAB — NOVEL CORONAVIRUS, NAA: SARS-CoV-2, NAA: NOT DETECTED
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
## 2020-03-03 HISTORY — PX: OTHER SURGICAL HISTORY: SHX169

## 2020-12-17 ENCOUNTER — Other Ambulatory Visit: Payer: Medicaid Other | Admitting: Obstetrics & Gynecology

## 2021-02-21 ENCOUNTER — Other Ambulatory Visit: Payer: Self-pay

## 2021-02-21 ENCOUNTER — Other Ambulatory Visit (HOSPITAL_COMMUNITY)
Admission: RE | Admit: 2021-02-21 | Discharge: 2021-02-21 | Disposition: A | Discharge status: Home / Self Care | Payer: Medicaid Other | Source: Ambulatory Visit | Attending: Adult Health | Admitting: Adult Health

## 2021-02-21 ENCOUNTER — Ambulatory Visit (INDEPENDENT_AMBULATORY_CARE_PROVIDER_SITE_OTHER): Payer: Medicaid Other | Admitting: Adult Health

## 2021-02-21 ENCOUNTER — Encounter: Payer: Self-pay | Admitting: Adult Health

## 2021-02-21 VITALS — BP 102/75 | HR 83 | Ht 67.25 in | Wt 209.0 lb

## 2021-02-21 DIAGNOSIS — Z113 Encounter for screening for infections with a predominantly sexual mode of transmission: Secondary | ICD-10-CM | POA: Diagnosis not present

## 2021-02-21 DIAGNOSIS — Z01419 Encounter for gynecological examination (general) (routine) without abnormal findings: Secondary | ICD-10-CM | POA: Diagnosis not present

## 2021-02-21 NOTE — Progress Notes (Signed)
Patient ID: Beth Hansen, female   DOB: 26-Sep-1985, 35 y.o.   MRN: [id number] History of Present Illness: Beth Hansen is a 35 year old black female, with SO, G2P2 in for a well woman gyn exam, she had tubal reversal last year. Lab Results  Component Value Date   DIAGPAP  12/12/2018    NEGATIVE FOR INTRAEPITHELIAL LESIONS OR MALIGNANCY.   HPV NOT DETECTED 12/12/2018      Current Medications, Allergies, Past Medical History, Past Surgical History, Family History and Social History were reviewed in [employer] record.     Review of Systems: Patient denies any headaches, hearing loss, fatigue, blurred vision, shortness of breath, chest pain, abdominal pain, problems with bowel movements, urination, or intercourse. No joint pain or mood swings.  Periods regular and last about 2 days    Physical Exam:BP 102/75 (BP Location: Left Arm, Patient Position: Sitting, Cuff Size: Large)   Pulse 83   Ht 5' 7.25" (1.708 m)   Wt 209 lb (94.8 kg)   LMP 02/15/2021   BMI 32.49 kg/m   General:  Well developed, well nourished, no acute distress Skin:  Warm and dry Neck:  Midline trachea, normal thyroid, good ROM, no lymphadenopathy Lungs; Clear to auscultation bilaterally Breast:  No dominant palpable mass, retraction, or nipple discharge Cardiovascular: Regular rate and rhythm Abdomen:  Soft, non tender, no hepatosplenomegaly Pelvic:  External genitalia is normal in appearance, no lesions.  The vagina is normal in appearance.Has white discharge, CV swab obtained. Urethra has no lesions or masses. The cervix is bulbous.  Uterus is felt to be normal size, shape, and contour.  No adnexal masses or tenderness noted.Bladder is non tender, no masses felt. Rectal:Deferred Extremities/musculoskeletal:  No swelling or varicosities noted, no clubbing or cyanosis Psych:  No mood changes, alert and cooperative,seems happy AA is 1 Fall risk is low Depression screen PHQ 2/9 02/21/2021   Decreased Interest 0  Down, Depressed, Hopeless 0  PHQ - 2 Score 0  Altered sleeping 0  Tired, decreased energy 0  Change in appetite 0  Feeling bad or failure about yourself  0  Trouble concentrating 0  Moving slowly or fidgety/restless 0  Suicidal thoughts 0  PHQ-9 Score 0    GAD 7 : Generalized Anxiety Score 02/21/2021  Nervous, Anxious, on Edge 0  Control/stop worrying 1  Worry too much - different things 1  Trouble relaxing 0  Restless 0  Easily annoyed or irritable 0  Afraid - awful might happen 0  Total GAD 7 Score 2      Upstream - 02/21/21 1157       Pregnancy Intention Screening   Does the patient want to become pregnant in the next year? Ok Either Way    Does the patient's partner want to become pregnant in the next year? Ok Either Way    Would the patient like to discuss contraceptive options today? No      Contraception Wrap Up   Current Method No Method - Other Reason    End Method No Method - Other Reason            Examination chaperoned by Levy Pupa LPN   Impression and Plan: 1. Encounter for well woman exam with routine gynecological exam Pap and physical in 1 year Take OTC PNV or folic acid Mammogram at 40  2. Screening examination for STD (sexually transmitted disease) CV swab sent for GC/CHL,trich and BV,yeast  She declines blood labs

## 2021-02-24 LAB — CERVICOVAGINAL ANCILLARY ONLY
Bacterial Vaginitis (gardnerella): POSITIVE — AB
Candida Glabrata: NEGATIVE
Candida Vaginitis: NEGATIVE
Chlamydia: NEGATIVE
Comment: NEGATIVE
Comment: NEGATIVE
Comment: NEGATIVE
Comment: NEGATIVE
Comment: NEGATIVE
Comment: NORMAL
Neisseria Gonorrhea: NEGATIVE
Trichomonas: NEGATIVE

## 2021-02-25 ENCOUNTER — Other Ambulatory Visit: Payer: Self-pay | Admitting: Adult Health

## 2021-02-25 MED ORDER — METRONIDAZOLE 500 MG PO TABS: 500.0000 mg | ORAL_TABLET | Freq: Two times a day (BID) | ORAL | 0 refills | Status: DC

## 2021-02-25 NOTE — Progress Notes (Signed)
+  BV on vaginal swab rx flagyl  

## 2021-10-23 ENCOUNTER — Other Ambulatory Visit: Payer: Self-pay | Admitting: Obstetrics & Gynecology

## 2021-10-23 DIAGNOSIS — N760 Acute vaginitis: Secondary | ICD-10-CM

## 2021-10-23 MED ORDER — METRONIDAZOLE 500 MG PO TABS: 500.0000 mg | ORAL_TABLET | Freq: Two times a day (BID) | ORAL | 1 refills | Status: DC

## 2021-10-23 NOTE — Progress Notes (Signed)
Rx for Flagyl ?

## 2022-04-28 ENCOUNTER — Encounter: Payer: Self-pay | Admitting: Obstetrics & Gynecology

## 2022-04-28 ENCOUNTER — Ambulatory Visit (INDEPENDENT_AMBULATORY_CARE_PROVIDER_SITE_OTHER): Payer: BC Managed Care – PPO | Admitting: Obstetrics & Gynecology

## 2022-04-28 ENCOUNTER — Other Ambulatory Visit (HOSPITAL_COMMUNITY)
Admission: RE | Admit: 2022-04-28 | Discharge: 2022-04-28 | Disposition: A | Discharge status: Home / Self Care | Payer: BC Managed Care – PPO | Source: Ambulatory Visit | Attending: Obstetrics & Gynecology | Admitting: Obstetrics & Gynecology

## 2022-04-28 VITALS — BP 119/79 | HR 66 | Ht 68.0 in | Wt 204.0 lb

## 2022-04-28 DIAGNOSIS — Z01419 Encounter for gynecological examination (general) (routine) without abnormal findings: Secondary | ICD-10-CM | POA: Diagnosis present

## 2022-04-28 DIAGNOSIS — Z113 Encounter for screening for infections with a predominantly sexual mode of transmission: Secondary | ICD-10-CM | POA: Insufficient documentation

## 2022-04-28 DIAGNOSIS — Z0000 Encounter for general adult medical examination without abnormal findings: Secondary | ICD-10-CM | POA: Diagnosis not present

## 2022-04-28 MED ORDER — DESOGESTREL-ETHINYL ESTRADIOL 0.15-0.02/0.01 MG (21/5) PO TABS: 1.0000 | ORAL_TABLET | Freq: Every day | ORAL | 11 refills | Status: DC

## 2022-04-28 NOTE — Addendum Note (Signed)
Addended by: Jesusita Oka on: 04/28/2022 11:46 AM   Modules accepted: Orders

## 2022-04-28 NOTE — Progress Notes (Signed)
Subjective:     Beth Hansen is a 36 y.o. female here for a routine exam.  Patient's last menstrual period was 04/12/2022. [id number] Birth Control Method:  none Menstrual Calendar(currently): regular   Current complaints: left breast tenderness.   Current acute medical issues:  none   Recent Gynecologic History Patient's last menstrual period was 04/12/2022. Last Pap: 6/20,  normal Last mammogram: na,     Past Medical History:  Diagnosis Date   Anemia     Past Surgical History:  Procedure Laterality Date   IUD REMOVAL N/A 12/06/2013   Procedure: INTRAUTERINE DEVICE (IUD) REMOVAL;  Surgeon: Florian Buff, MD;  Location: [facility];  Service: Gynecology;  Laterality: N/A;   LAPAROSCOPIC TUBAL LIGATION Bilateral 12/06/2013   Procedure: LAPAROSCOPIC BILATERAL TUBAL CAUTERIZATION USING ELECTROCAUTERY;  Surgeon: Florian Buff, MD;  Location: [facility];  Service: Gynecology;  Laterality: Bilateral;   tubal reversal  03/03/2020   WISDOM TOOTH EXTRACTION      OB History     Gravida  2   Para  2   Term  2   Preterm  0   AB  0   Living  2      SAB  0   IAB  0   Ectopic  0   Multiple  0   Live Births  2           Social History   Socioeconomic History   Marital status: Significant Other    Spouse name: Not on file   Number of children: 2   Years of education: Not on file   Highest education level: Not on file  Occupational History   Not on file  Tobacco Use   Smoking status: Never   Smokeless tobacco: Never  Vaping Use   Vaping Use: Never used  Substance and Sexual Activity   Alcohol use: Yes    Comment: occ   Drug use: No   Sexual activity: Not Currently    Birth control/protection: Abstinence  Other Topics Concern   Not on file  Social History Narrative   Not on file   Social Determinants of Health   Financial Resource Strain: Low Risk  (04/28/2022)   Overall Financial Resource Strain (CARDIA)    Difficulty of Paying Living Expenses: Not  very hard  Food Insecurity: No Food Insecurity (04/28/2022)   Hunger Vital Sign    Worried About Running Out of Food in the Last Year: Never true    Ran Out of Food in the Last Year: Never true  Transportation Needs: No Transportation Needs (04/28/2022)   PRAPARE - Hydrologist (Medical): No    Lack of Transportation (Non-Medical): No  Physical Activity: Sufficiently Active (04/28/2022)   Exercise Vital Sign    Days of Exercise per Week: 6 days    Minutes of Exercise per Session: 90 min  Stress: No Stress Concern Present (04/28/2022)   [place]    Feeling of Stress : Only a little  Social Connections: Moderately Integrated (04/28/2022)   Social Connection and Isolation Panel [NHANES]    Frequency of Communication with Friends and Family: More than three times a week    Frequency of Social Gatherings with Friends and Family: Twice a week    Attends Religious Services: More than 4 times per year    Active Member of [employer] or Organizations: Yes    Attends Archivist Meetings: More  than 4 times per year    Marital Status: Divorced    Family History  Problem Relation Age of Onset   Diabetes Maternal Grandmother    Hypertension Maternal Grandmother    Hypertension Father    Other Neg Hx      Current Outpatient Medications:    desogestrel-ethinyl estradiol (MIRCETTE) 0.15-0.02/0.01 MG (21/5) tablet, Take 1 tablet by mouth daily., Disp: 28 tablet, Rfl: 11  Review of Systems  Review of Systems  Constitutional: Negative for fever, chills, weight loss, malaise/fatigue and diaphoresis.  HENT: Negative for hearing loss, ear pain, nosebleeds, congestion, sore throat, neck pain, tinnitus and ear discharge.   Eyes: Negative for blurred vision, double vision, photophobia, pain, discharge and redness.  Respiratory: Negative for cough, hemoptysis, sputum production, shortness of breath,  wheezing and stridor.   Cardiovascular: Negative for chest pain, palpitations, orthopnea, claudication, leg swelling and PND.  Gastrointestinal: negative for abdominal pain. Negative for heartburn, nausea, vomiting, diarrhea, constipation, blood in stool and melena.  Genitourinary: Negative for dysuria, urgency, frequency, hematuria and flank pain.  Musculoskeletal: Negative for myalgias, back pain, joint pain and falls.  Skin: Negative for itching and rash.  Neurological: Negative for dizziness, tingling, tremors, sensory change, speech change, focal weakness, seizures, loss of consciousness, weakness and headaches.  Endo/Heme/Allergies: Negative for environmental allergies and polydipsia. Does not bruise/bleed easily.  Psychiatric/Behavioral: Negative for depression, suicidal ideas, hallucinations, memory loss and substance abuse. The patient is not nervous/anxious and does not have insomnia.        Objective:  Blood pressure 119/79, pulse 66, height '5\' 8"'$  (1.727 m), weight 204 lb (92.5 kg), last menstrual period 04/12/2022.   Physical Exam  Vitals reviewed. Constitutional: She is oriented to person, place, and time. She appears well-developed and well-nourished.  HENT:  Head: Normocephalic and atraumatic.        Right Ear: External ear normal.  Left Ear: External ear normal.  Nose: Nose normal.  Mouth/Throat: Oropharynx is clear and moist.  Eyes: Conjunctivae and EOM are normal. Pupils are equal, round, and reactive to light. Right eye exhibits no discharge. Left eye exhibits no discharge. No scleral icterus.  Neck: Normal range of motion. Neck supple. No tracheal deviation present. No thyromegaly present.  Cardiovascular: Normal rate, regular rhythm, normal heart sounds and intact distal pulses.  Exam reveals no gallop and no friction rub.   No murmur heard. Respiratory: Effort normal and breath sounds normal. No respiratory distress. She has no wheezes. She has no rales. She exhibits  no tenderness.  GI: Soft. Bowel sounds are normal. She exhibits no distension and no mass. There is no tenderness. There is no rebound and no guarding.  Genitourinary:  Breasts no masses skin changes or nipple changes bilaterally      Vulva is normal without lesions Vagina is pink moist without discharge Cervix normal in appearance and pap is done Uterus is normal size shape and contour Adnexa is negative with normal sized ovaries   Musculoskeletal: Normal range of motion. She exhibits no edema and no tenderness.  Neurological: She is alert and oriented to person, place, and time. She has normal reflexes. She displays normal reflexes. No cranial nerve deficit. She exhibits normal muscle tone. Coordination normal.  Skin: Skin is warm and dry. No rash noted. No erythema. No pallor.  Psychiatric: She has a normal mood and affect. Her behavior is normal. Judgment and thought content normal.       Medications Ordered at today's visit: Meds ordered this encounter  Medications   desogestrel-ethinyl estradiol (MIRCETTE) 0.15-0.02/0.01 MG (21/5) tablet    Sig: Take 1 tablet by mouth daily.    Dispense:  28 tablet    Refill:  11    Other orders placed at today's visit: No orders of the defined types were placed in this encounter.     Assessment:    Normal Gyn exam.    Plan:    Contraception: none.   Begin COC  No follow-ups on file.

## 2022-04-29 LAB — CYTOLOGY - PAP
Chlamydia: NEGATIVE
Comment: NEGATIVE
Comment: NEGATIVE
Comment: NORMAL
Diagnosis: NEGATIVE
High risk HPV: NEGATIVE
Neisseria Gonorrhea: NEGATIVE

## 2022-09-10 ENCOUNTER — Emergency Department (HOSPITAL_BASED_OUTPATIENT_CLINIC_OR_DEPARTMENT_OTHER): Payer: BC Managed Care – PPO

## 2022-09-10 ENCOUNTER — Other Ambulatory Visit: Payer: Self-pay

## 2022-09-10 ENCOUNTER — Encounter (HOSPITAL_BASED_OUTPATIENT_CLINIC_OR_DEPARTMENT_OTHER): Payer: Self-pay

## 2022-09-10 ENCOUNTER — Inpatient Hospital Stay (HOSPITAL_BASED_OUTPATIENT_CLINIC_OR_DEPARTMENT_OTHER)
Admission: EM | Admit: 2022-09-10 | Discharge: 2022-09-11 | Disposition: A | Discharge status: Home / Self Care | Payer: BC Managed Care – PPO | Attending: Obstetrics and Gynecology | Admitting: Obstetrics and Gynecology

## 2022-09-10 DIAGNOSIS — K661 Hemoperitoneum: Secondary | ICD-10-CM | POA: Diagnosis not present

## 2022-09-10 DIAGNOSIS — O00102 Left tubal pregnancy without intrauterine pregnancy: Secondary | ICD-10-CM

## 2022-09-10 DIAGNOSIS — O009 Unspecified ectopic pregnancy without intrauterine pregnancy: Secondary | ICD-10-CM

## 2022-09-10 DIAGNOSIS — R1032 Left lower quadrant pain: Secondary | ICD-10-CM | POA: Diagnosis present

## 2022-09-10 LAB — COMPREHENSIVE METABOLIC PANEL
ALT: 39 U/L (ref 0–44)
AST: 83 U/L — ABNORMAL HIGH (ref 15–41)
Albumin: 3.8 g/dL (ref 3.5–5.0)
Alkaline Phosphatase: 50 U/L (ref 38–126)
Anion gap: 8 (ref 5–15)
BUN: 6 mg/dL (ref 6–20)
CO2: 24 mmol/L (ref 22–32)
Calcium: 9.3 mg/dL (ref 8.9–10.3)
Chloride: 106 mmol/L (ref 98–111)
Creatinine, Ser: 0.72 mg/dL (ref 0.44–1.00)
GFR, Estimated: >60 mL/min (ref >60)
Glucose, Bld: 88 mg/dL (ref 70–99)
Potassium: 3.7 mmol/L (ref 3.5–5.1)
Sodium: 138 mmol/L (ref 135–145)
Total Bilirubin: 0.4 mg/dL (ref 0.3–1.2)
Total Protein: 7.9 g/dL (ref 6.5–8.1)

## 2022-09-10 LAB — URINALYSIS, ROUTINE W REFLEX MICROSCOPIC
Bacteria, UA: NONE SEEN
Bilirubin Urine: NEGATIVE
Glucose, UA: NEGATIVE mg/dL
Hgb urine dipstick: NEGATIVE
Ketones, ur: NEGATIVE mg/dL
Nitrite: NEGATIVE
Specific Gravity, Urine: 1.03 (ref 1.005–1.030)
pH: 6.5 (ref 5.0–8.0)

## 2022-09-10 LAB — CBC
HCT: 39.6 % (ref 36.0–46.0)
Hemoglobin: 13.1 g/dL (ref 12.0–15.0)
MCH: 27.8 pg (ref 26.0–34.0)
MCHC: 33.1 g/dL (ref 30.0–36.0)
MCV: 83.9 fL (ref 80.0–100.0)
Platelets: 289 10*3/uL (ref 150–400)
RBC: 4.72 MIL/uL (ref 3.87–5.11)
RDW: 13.1 % (ref 11.5–15.5)
WBC: 6.1 10*3/uL (ref 4.0–10.5)
nRBC: 0 % (ref 0.0–0.2)

## 2022-09-10 LAB — HCG, QUANTITATIVE, PREGNANCY: hCG, Beta Chain, Quant, S: 739 m[IU]/mL — ABNORMAL HIGH (ref <5)

## 2022-09-10 LAB — PREGNANCY, URINE: Preg Test, Ur: POSITIVE — AB

## 2022-09-10 LAB — LIPASE, BLOOD: Lipase: <10 U/L — ABNORMAL LOW (ref 11–51)

## 2022-09-10 NOTE — ED Provider Notes (Addendum)
[facility] Provider Note   CSN: [id number] Arrival date & time: 09/10/22  1928     History  Chief Complaint  Patient presents with   Abdominal Pain    Beth Hansen is a 37 y.o. female G4P2 with PMH anemia who presents to ED c/o LLQ constant, sharp pain x 3 days. She went to [facility] where she had positive pregnancy test.  She was unaware she was pregnant prior to this test today.  She has a history of tubal pregnancy on the right last year that she states was managed with surgical intervention. Her LMP was 2/3-2/5. She denies vaginal bleeding, vaginal discharge, dysuria, hematuria, constipation, diarrhea, nausea, or vomiting.  She has not taken any medications at home for the pain.  She declines pain medication on initial assessment.  Patient states that she was last checked for STDs approximately 1 to 2 months ago and was negative but is okay with being rechecked today for completeness.     Home Medications No daily prescription medications  Allergies    Latex    Review of Systems   Review of Systems  All other systems reviewed and are negative.   Physical Exam Updated Vital Signs BP 109/73   Pulse 90   Temp 98 F (36.7 C)   Resp 18   Ht '5\' 8"'$  (1.727 m)   Wt 96.6 kg   SpO2 100%   BMI 32.39 kg/m  Physical Exam Vitals and nursing note reviewed.  Constitutional:      General: She is not in acute distress.    Appearance: Normal appearance. She is not ill-appearing, toxic-appearing or diaphoretic.  HENT:     Head: Normocephalic and atraumatic.     Mouth/Throat:     Mouth: Mucous membranes are moist.  Eyes:     General: No scleral icterus.    Conjunctiva/sclera: Conjunctivae normal.  Cardiovascular:     Rate and Rhythm: Normal rate and regular rhythm.     Heart sounds: No murmur heard. Pulmonary:     Effort: Pulmonary effort is normal.     Breath sounds: Normal breath sounds.  Abdominal:     General: Abdomen is  flat. There is no distension.     Palpations: Abdomen is soft. There is no shifting dullness, fluid wave, hepatomegaly, splenomegaly, mass or pulsatile mass.     Tenderness: There is abdominal tenderness (moderate) in the left lower quadrant. There is left CVA tenderness and guarding (voluntary, moderate to LLQ). There is no right CVA tenderness or rebound.  Musculoskeletal:        General: Normal range of motion.     Cervical back: Neck supple.     Right lower leg: No edema.     Left lower leg: No edema.  Skin:    General: Skin is warm and dry.     Capillary Refill: Capillary refill takes less than 2 seconds.  Neurological:     General: No focal deficit present.     Mental Status: She is alert and oriented to person, place, and time. Mental status is at baseline.  Psychiatric:        Mood and Affect: Mood normal.        Behavior: Behavior normal.     ED Results / Procedures / Treatments   Labs (all labs ordered are listed, but only abnormal results are displayed) Labs Reviewed  LIPASE, BLOOD - Abnormal; Notable for the following components:  Result Value   Lipase <10 (*)    All other components within normal limits  COMPREHENSIVE METABOLIC PANEL - Abnormal; Notable for the following components:   AST 83 (*)    All other components within normal limits  URINALYSIS, ROUTINE W REFLEX MICROSCOPIC - Abnormal; Notable for the following components:   Protein, ur TRACE (*)    Leukocytes,Ua LARGE (*)    All other components within normal limits  PREGNANCY, URINE - Abnormal; Notable for the following components:   Preg Test, Ur POSITIVE (*)    All other components within normal limits  HCG, QUANTITATIVE, PREGNANCY - Abnormal; Notable for the following components:   hCG, Beta Chain, Quant, S 739 (*)    All other components within normal limits  URINE CULTURE  CBC  RPR  HIV ANTIBODY (ROUTINE TESTING W REFLEX)  GC/CHLAMYDIA PROBE AMP ([place]) NOT AT [facility]     EKG None  Radiology US OB LESS THAN 14 WEEKS WITH OB TRANSVAGINAL  Result Date: 09/11/2022 CLINICAL DATA:  [id number], [id number]. Positive pregnancy test with left lower quadrant pain. Beta HCG 739. EXAM: OBSTETRIC <14 WK [place] AND TRANSVAGINAL OB US DOPPLER ULTRASOUND OF OVARIES TECHNIQUE: Both transabdominal and transvaginal ultrasound examinations were performed for complete evaluation of the gestation as well as the maternal uterus, adnexal regions, and pelvic cul-de-sac. Transvaginal technique was performed to assess early pregnancy. Color and duplex Doppler ultrasound was utilized to evaluate blood flow to the ovaries. COMPARISON:  None Available. FINDINGS: Intrauterine gestational sac: None. Yolk sac:  Not visualized. Embryo:  Not visualized. Cardiac Activity: N/a. MSD: None. CRL: None. Subchorionic hemorrhage:  None visualized. Maternal uterus/adnexae: There is a moderate to large amount of complex pelvic free fluid concerning for hemoperitoneum. There is an anteverted uterus measuring 9.5 x 5.7 by 5.7 cm. No wall mass is seen. The endometrium is thickened measuring 2 cm but it is normally circumscribed. There is a small subcentimeter nabothian cyst in the otherwise unremarkable cervix. On the transabdominal scan, in the left adnexa there is a heterogeneous 6.5 x 3 x 5.2 cm mass which appears separate from the ovary, with scant questionable color flow at the periphery. This could represent an ectopic gestation or hematoma. The left ovary is best seen on the endovaginal scan where it measures 2.5 x 1.8 x 2.8 cm and is unremarkable. There are scant subcentimeter simple follicles in the right ovary but no abnormality. The right ovary measures 2.8 x 2.1 x 2.7 cm. Color doppler evaluation of both ovaries demonstrates normal appearing color flow. IMPRESSION: 1. There is a 6.5 x 3 x 5.2 cm heterogeneous left adnexal mass on the transabdominal scan suspicious for either an ectopic pregnancy or a hematoma. 2.  Moderate to large volume of complex free fluid worrisome for hemoperitoneum such as due to ruptured ectopic pregnancy. Appropriate action should be taken. 3. No intrauterine gestational sac. 4. Discussed over the phone with PA Dallie Piles at 12:07 a.m., 09/11/2022, with verbal acknowledgement of key findings. Electronically Signed   By: Telford Nab M.D.   On: 09/11/2022 00:14    Procedures .Critical Care  Performed by: Suzzette Righter, PA-C Authorized by: Suzzette Righter, PA-C   Critical care provider statement:    Critical care time (minutes):  45   Critical care time was exclusive of:  Separately billable procedures and treating other patients and teaching time   Critical care was necessary to treat or prevent imminent or life-threatening deterioration of the following conditions:  Cardiac  failure, circulatory failure, renal failure, respiratory failure and metabolic crisis   Critical care was time spent personally by me on the following activities:  Development of treatment plan with patient or surrogate, discussions with consultants, evaluation of patient's response to treatment, examination of patient, ordering and review of laboratory studies, ordering and review of radiographic studies, ordering and performing treatments and interventions, pulse oximetry, re-evaluation of patient's condition and review of old charts   Care discussed with: admitting provider       Medications Ordered in ED Medications - No data to display  ED Course/ Medical Decision Making/ A&P                             Medical Decision Making Amount and/or Complexity of Data Reviewed Labs: ordered. Decision-making details documented in ED Course. Radiology: ordered. Decision-making details documented in ED Course.  Risk Decision regarding hospitalization.   Medical Decision Making:   DALIYA SUMI is a 37 y.o. female who presented to the ED today with left lower quadrant abdominal pain detailed  above.    Patient's presentation is complicated by their history of previous ectopic pregnancy, positive pregnancy test today.  Patient placed on continuous vitals and telemetry monitoring while in ED which was reviewed periodically.  Complete initial physical exam performed, notably the patient was in no acute distress but did have moderate tenderness over the left lower quadrant with positive voluntary guarding.  Abdomen did not appear distended and was soft.  Remainder of abdomen was nontender.  Patient was neurologically intact, alert and oriented.  Vital signs stable with normal blood pressure, no tachycardia, and normal oxygen saturation on room air. Reviewed and confirmed nursing documentation for past medical history, family history, social history.    Initial Assessment:   With the patient's presentation of left lower quadrant abdominal pain, most likely diagnosis is ectopic pregnancy. Differential diagnosis includes but is not limited to intrauterine pregnancy, spontaneous abortion, incomplete abortion, ovarian torsion, PID, mesenteric ischemia, appendicitis, diverticulitis, DKA, gastritis, gastroenteritis, AMI, nephrolithiasis, pancreatitis, peritonitis, constipation, UTI, SBO/LBO, splenic rupture, biliary disease, IBD, IBS, PUD, hepatitis, broad ligament pain.   This is most consistent with an acute complicated illness  Initial Plan:  Screening labs including CBC and Metabolic panel to evaluate for infectious or metabolic etiology of disease.  Lipase to evaluate for pancreatitis STD panel Urinalysis with reflex culture ordered to evaluate for UTI or relevant urologic/nephrologic pathology.  Transvaginal and abdominal ultrasound to evaluate for ectopic pregnancy, intrauterine pregnancy, ovarian torsion, and other pelvic abnormalities  Pain medication offered but declined on initial and repeat evaluations  Objective evaluation as reviewed   Initial Study Results:   Laboratory  All  laboratory results reviewed without evidence of clinically relevant pathology.   Exceptions include: Positive pregnancy test, beta hCG at [place]  Radiology:  All images reviewed independently. Agree with radiology report at this time.   US OB LESS THAN 14 WEEKS WITH OB TRANSVAGINAL  Result Date: 09/11/2022 CLINICAL DATA:  [id number], [id number]. Positive pregnancy test with left lower quadrant pain. Beta HCG 739. EXAM: OBSTETRIC <14 WK [place] AND TRANSVAGINAL OB US DOPPLER ULTRASOUND OF OVARIES TECHNIQUE: Both transabdominal and transvaginal ultrasound examinations were performed for complete evaluation of the gestation as well as the maternal uterus, adnexal regions, and pelvic cul-de-sac. Transvaginal technique was performed to assess early pregnancy. Color and duplex Doppler ultrasound was utilized to evaluate blood flow to the ovaries. COMPARISON:  None Available. FINDINGS:  Intrauterine gestational sac: None. Yolk sac:  Not visualized. Embryo:  Not visualized. Cardiac Activity: N/a. MSD: None. CRL: None. Subchorionic hemorrhage:  None visualized. Maternal uterus/adnexae: There is a moderate to large amount of complex pelvic free fluid concerning for hemoperitoneum. There is an anteverted uterus measuring 9.5 x 5.7 by 5.7 cm. No wall mass is seen. The endometrium is thickened measuring 2 cm but it is normally circumscribed. There is a small subcentimeter nabothian cyst in the otherwise unremarkable cervix. On the transabdominal scan, in the left adnexa there is a heterogeneous 6.5 x 3 x 5.2 cm mass which appears separate from the ovary, with scant questionable color flow at the periphery. This could represent an ectopic gestation or hematoma. The left ovary is best seen on the endovaginal scan where it measures 2.5 x 1.8 x 2.8 cm and is unremarkable. There are scant subcentimeter simple follicles in the right ovary but no abnormality. The right ovary measures 2.8 x 2.1 x 2.7 cm. Color doppler evaluation of both ovaries  demonstrates normal appearing color flow. IMPRESSION: 1. There is a 6.5 x 3 x 5.2 cm heterogeneous left adnexal mass on the transabdominal scan suspicious for either an ectopic pregnancy or a hematoma. 2. Moderate to large volume of complex free fluid worrisome for hemoperitoneum such as due to ruptured ectopic pregnancy. Appropriate action should be taken. 3. No intrauterine gestational sac. 4. Discussed over the phone with PA Dallie Piles at 12:07 a.m., 09/11/2022, with verbal acknowledgement of key findings. Electronically Signed   By: Telford Nab M.D.   On: 09/11/2022 00:14      Consults: Case discussed with radiologist who recommends emergent consult with OB/GYN.  Case discussed with OB/GYN who recommends transfer via EMS to MAU unit for further evaluation and treatment.   Final Assessment and Plan:   This is a 37 year old female G4, P2 presents to the ED with left lower quadrant pain for 3 days.  She presented to urgent care for evaluation today where she had a positive pregnancy test.  She did not know she was pregnant prior to this.  Patient has history of previous ectopic pregnancy on the right in 2023 that was managed surgically.  She has no other associated symptoms.  On today's arrival, patient has stable vital signs but on initial physical exam she does have moderate tenderness in the left lower quadrant with voluntary guarding.  Abdomen was soft diffusely with no signs of distention.  Workup obtained as above for further assessment.  Urine pregnancy positive here today as well.  Patient with beta hCG of 739.  Hemoglobin stable at 13.1 and remainder of blood work thus far unremarkable.  Ultrasound obtained to rule out ectopic pregnancy and other pelvic etiology of patient's symptoms.  Ultrasound report called by radiologist to me at the time noted above with high suspicion for ruptured ectopic pregnancy on the left with hemoperitoneum.  OB/GYN immediately consulted and will have patient  transferred over to MAU for further evaluation and treatment.  Patient and boyfriend at bedside updated on all findings and plan to transfer to MAU via EMS. Pt remains stable with no signs of acute distress.  As patient denies vaginal bleeding or discharge and with radiology findings will require emergent transfer for treatment of pelvic pain/GYN, pelvic exam deferred as it will not change management.  Market researcher notified of emergent need to have pt transferred to MAU ASAP and process has been initiated. Pt agreeable with transfer and plan at time  of disposition.    Clinical Impression:  1. Left tubal pregnancy without intrauterine pregnancy   2. Hemoperitoneum      Admit           Final Clinical Impression(s) / ED Diagnoses Final diagnoses:  Left tubal pregnancy without intrauterine pregnancy  Hemoperitoneum    Rx / DC Orders ED Discharge Orders     None         Suzzette Righter, PA-C 09/11/22 0038    Lyndee Herbst, Arvella Merles, PA-C 09/11/22 0416    Malvin Johns, MD 09/11/22 0945

## 2022-09-10 NOTE — ED Triage Notes (Signed)
Pt arrives to ED POV c/o LLQ pain x3 days. Pt states N/V/D and flank pain. Pt followed up with her PCP where she had a positive pregnancy test and was advised to come to ED to "rule out tubal pregnancy or kidney issues". No other complaints at this time. Pt A/O x4.

## 2022-09-10 NOTE — ED Notes (Signed)
Patient transported to Ultrasound by rad staff.

## 2022-09-11 ENCOUNTER — Inpatient Hospital Stay (HOSPITAL_COMMUNITY): Payer: BC Managed Care – PPO | Admitting: Anesthesiology

## 2022-09-11 ENCOUNTER — Encounter (HOSPITAL_COMMUNITY)
Admission: EM | Disposition: A | Discharge status: Home / Self Care | Payer: Self-pay | Source: Home / Self Care | Attending: Emergency Medicine

## 2022-09-11 ENCOUNTER — Encounter (HOSPITAL_COMMUNITY): Payer: Self-pay

## 2022-09-11 ENCOUNTER — Other Ambulatory Visit: Payer: Self-pay

## 2022-09-11 DIAGNOSIS — K661 Hemoperitoneum: Secondary | ICD-10-CM

## 2022-09-11 DIAGNOSIS — R1032 Left lower quadrant pain: Secondary | ICD-10-CM | POA: Diagnosis present

## 2022-09-11 DIAGNOSIS — O00102 Left tubal pregnancy without intrauterine pregnancy: Secondary | ICD-10-CM

## 2022-09-11 DIAGNOSIS — Z3A01 Less than 8 weeks gestation of pregnancy: Secondary | ICD-10-CM | POA: Diagnosis not present

## 2022-09-11 DIAGNOSIS — O009 Unspecified ectopic pregnancy without intrauterine pregnancy: Secondary | ICD-10-CM

## 2022-09-11 HISTORY — PX: LAPAROSCOPIC UNILATERAL SALPINGECTOMY: SHX5934

## 2022-09-11 LAB — HIV ANTIBODY (ROUTINE TESTING W REFLEX): HIV Screen 4th Generation wRfx: NONREACTIVE

## 2022-09-11 LAB — TYPE AND SCREEN
ABO/RH(D): O NEG
Antibody Screen: NEGATIVE

## 2022-09-11 LAB — RPR: RPR Ser Ql: NONREACTIVE

## 2022-09-11 SURGERY — SALPINGECTOMY, UNILATERAL, LAPAROSCOPIC
Anesthesia: General | Laterality: Left

## 2022-09-11 MED ORDER — SCOPOLAMINE 1 MG/3DAYS TD PT72
MEDICATED_PATCH | TRANSDERMAL | Status: AC
  Filled 2022-09-11: qty 1

## 2022-09-11 MED ORDER — FENTANYL CITRATE (PF) 100 MCG/2ML IJ SOLN
25.0000 ug | INTRAMUSCULAR | Status: DC | PRN
  Administered 2022-09-11 (×4): 50 ug via INTRAVENOUS

## 2022-09-11 MED ORDER — FENTANYL CITRATE (PF) 250 MCG/5ML IJ SOLN
INTRAMUSCULAR | Status: AC
  Filled 2022-09-11: qty 5

## 2022-09-11 MED ORDER — MIDAZOLAM HCL 5 MG/5ML IJ SOLN
INTRAMUSCULAR | Status: DC | PRN
  Administered 2022-09-11 (×2): 1 mg via INTRAVENOUS

## 2022-09-11 MED ORDER — IBUPROFEN 800 MG PO TABS: 800.0000 mg | ORAL_TABLET | Freq: Three times a day (TID) | ORAL | 0 refills | Status: DC

## 2022-09-11 MED ORDER — SUCCINYLCHOLINE CHLORIDE 20 MG/ML IJ SOLN
INTRAMUSCULAR | Status: DC | PRN
  Administered 2022-09-11: 140 mg via INTRAVENOUS

## 2022-09-11 MED ORDER — FENTANYL CITRATE (PF) 100 MCG/2ML IJ SOLN
INTRAMUSCULAR | Status: DC | PRN
  Administered 2022-09-11 (×5): 50 ug via INTRAVENOUS

## 2022-09-11 MED ORDER — PROPOFOL 10 MG/ML IV BOLUS
INTRAVENOUS | Status: DC | PRN
  Administered 2022-09-11: 150 mg via INTRAVENOUS

## 2022-09-11 MED ORDER — PROPOFOL 10 MG/ML IV BOLUS
INTRAVENOUS | Status: AC
  Filled 2022-09-11: qty 20

## 2022-09-11 MED ORDER — DEXAMETHASONE SODIUM PHOSPHATE 4 MG/ML IJ SOLN
INTRAMUSCULAR | Status: DC | PRN
  Administered 2022-09-11: 10 mg via INTRAVENOUS

## 2022-09-11 MED ORDER — ROCURONIUM BROMIDE 100 MG/10ML IV SOLN
INTRAVENOUS | Status: DC | PRN
  Administered 2022-09-11: 10 mg via INTRAVENOUS
  Administered 2022-09-11: 50 mg via INTRAVENOUS
  Administered 2022-09-11: 10 mg via INTRAVENOUS

## 2022-09-11 MED ORDER — LACTATED RINGERS IV SOLN: INTRAVENOUS | Status: DC | PRN

## 2022-09-11 MED ORDER — KETOROLAC TROMETHAMINE 30 MG/ML IJ SOLN
INTRAMUSCULAR | Status: AC
  Filled 2022-09-11: qty 1

## 2022-09-11 MED ORDER — OXYCODONE HCL 5 MG/5ML PO SOLN: 5.0000 mg | Freq: Once | ORAL | Status: AC | PRN

## 2022-09-11 MED ORDER — ACETAMINOPHEN 10 MG/ML IV SOLN
1000.0000 mg | Freq: Once | INTRAVENOUS | Status: DC | PRN
  Administered 2022-09-11: 1000 mg via INTRAVENOUS

## 2022-09-11 MED ORDER — SCOPOLAMINE 1 MG/3DAYS TD PT72
MEDICATED_PATCH | TRANSDERMAL | Status: DC | PRN
  Administered 2022-09-11: 1 via TRANSDERMAL

## 2022-09-11 MED ORDER — LIDOCAINE HCL (CARDIAC) PF 50 MG/5ML IV SOSY
PREFILLED_SYRINGE | INTRAVENOUS | Status: DC | PRN
  Administered 2022-09-11: 60 mg via INTRAVENOUS

## 2022-09-11 MED ORDER — ACETAMINOPHEN 500 MG PO TABS: 1000.0000 mg | ORAL_TABLET | Freq: Three times a day (TID) | ORAL | 0 refills | Status: AC

## 2022-09-11 MED ORDER — MIDAZOLAM HCL 2 MG/2ML IJ SOLN
INTRAMUSCULAR | Status: AC
  Filled 2022-09-11: qty 2

## 2022-09-11 MED ORDER — AMISULPRIDE (ANTIEMETIC) 5 MG/2ML IV SOLN
10.0000 mg | Freq: Once | INTRAVENOUS | Status: AC | PRN
  Administered 2022-09-11: 10 mg via INTRAVENOUS

## 2022-09-11 MED ORDER — ACETAMINOPHEN 10 MG/ML IV SOLN
INTRAVENOUS | Status: AC
  Filled 2022-09-11: qty 100

## 2022-09-11 MED ORDER — SODIUM CHLORIDE 0.9 % IR SOLN
Status: DC | PRN
  Administered 2022-09-11: 3000 mL

## 2022-09-11 MED ORDER — OXYCODONE HCL 5 MG PO TABS
5.0000 mg | ORAL_TABLET | Freq: Once | ORAL | Status: AC | PRN
  Administered 2022-09-11: 5 mg via ORAL

## 2022-09-11 MED ORDER — SENNOSIDES-DOCUSATE SODIUM 8.6-50 MG PO TABS: 1.0000 | ORAL_TABLET | Freq: Every day | ORAL | 0 refills | Status: DC

## 2022-09-11 MED ORDER — FENTANYL CITRATE (PF) 100 MCG/2ML IJ SOLN: 50.0000 ug | INTRAMUSCULAR | Status: DC | PRN

## 2022-09-11 MED ORDER — FENTANYL CITRATE (PF) 100 MCG/2ML IJ SOLN
INTRAMUSCULAR | Status: AC
  Filled 2022-09-11: qty 2

## 2022-09-11 MED ORDER — PROMETHAZINE HCL 25 MG/ML IJ SOLN: 6.2500 mg | INTRAMUSCULAR | Status: DC | PRN

## 2022-09-11 MED ORDER — KETOROLAC TROMETHAMINE 30 MG/ML IJ SOLN
30.0000 mg | Freq: Once | INTRAMUSCULAR | Status: AC
  Administered 2022-09-11: 30 mg via INTRAVENOUS

## 2022-09-11 MED ORDER — OXYCODONE HCL 5 MG PO TABS
ORAL_TABLET | ORAL | Status: AC
  Filled 2022-09-11: qty 1

## 2022-09-11 MED ORDER — ONDANSETRON HCL 4 MG/2ML IJ SOLN
INTRAMUSCULAR | Status: DC | PRN
  Administered 2022-09-11: 4 mg via INTRAVENOUS

## 2022-09-11 MED ORDER — OXYCODONE HCL 5 MG PO TABS: 5.0000 mg | ORAL_TABLET | ORAL | 0 refills | Status: DC | PRN

## 2022-09-11 MED ORDER — ACETAMINOPHEN 325 MG PO TABS: 325.0000 mg | ORAL_TABLET | ORAL | Status: DC | PRN

## 2022-09-11 MED ORDER — ACETAMINOPHEN 160 MG/5ML PO SOLN: 325.0000 mg | ORAL | Status: DC | PRN

## 2022-09-11 MED ORDER — SUGAMMADEX SODIUM 200 MG/2ML IV SOLN
INTRAVENOUS | Status: DC | PRN
  Administered 2022-09-11: 200 mg via INTRAVENOUS

## 2022-09-11 MED ORDER — AMISULPRIDE (ANTIEMETIC) 5 MG/2ML IV SOLN
INTRAVENOUS | Status: AC
  Filled 2022-09-11: qty 4

## 2022-09-11 SURGICAL SUPPLY — 34 items
APPLICATOR ARISTA FLEXITIP XL (MISCELLANEOUS) ×1 IMPLANT
BAG COUNTER SPONGE SURGICOUNT (BAG) ×2 IMPLANT
BLADE SURG 11 STRL SS (BLADE) ×2 IMPLANT
DEFOGGER SCOPE WARMER CLEARIFY (MISCELLANEOUS) ×1 IMPLANT
DERMABOND ADVANCED .7 DNX12 (GAUZE/BANDAGES/DRESSINGS) ×2 IMPLANT
DURAPREP 26ML APPLICATOR (WOUND CARE) ×2 IMPLANT
GAUZE 4X4 16PLY ~~LOC~~+RFID DBL (SPONGE) ×1 IMPLANT
GLOVE BIO SURGEON STRL SZ7 (GLOVE) ×2 IMPLANT
GLOVE BIOGEL PI IND STRL 7.0 (GLOVE) ×8 IMPLANT
GOWN STRL REUS W/ TWL LRG LVL3 (GOWN DISPOSABLE) ×2 IMPLANT
GOWN STRL REUS W/ TWL XL LVL3 (GOWN DISPOSABLE) ×2 IMPLANT
GOWN STRL REUS W/TWL LRG LVL3 (GOWN DISPOSABLE) ×2
GOWN STRL REUS W/TWL XL LVL3 (GOWN DISPOSABLE) ×2
HEMOSTAT ARISTA ABSORB 3G PWDR (HEMOSTASIS) ×1 IMPLANT
IRRIG SUCT STRYKERFLOW 2 WTIP (MISCELLANEOUS) ×2
IRRIGATION SUCT STRKRFLW 2 WTP (MISCELLANEOUS) ×1 IMPLANT
KIT TURNOVER KIT B (KITS) ×2 IMPLANT
LIGASURE LAP MARYLAND 5MM 37CM (ELECTROSURGICAL) ×1 IMPLANT
MANIFOLD NEPTUNE II (INSTRUMENTS) ×1 IMPLANT
PACK LAPAROSCOPY BASIN (CUSTOM PROCEDURE TRAY) ×2 IMPLANT
PACK TRENDGUARD 450 HYBRID PRO (MISCELLANEOUS) ×1 IMPLANT
PROTECTOR NERVE ULNAR (MISCELLANEOUS) ×4 IMPLANT
SCISSORS LAP 5X35 DISP (ENDOMECHANICALS) ×1 IMPLANT
SEALER TISSUE G2 CVD JAW 35 (ENDOMECHANICALS) ×2 IMPLANT
SEALER TISSUE G2 CVD JAW 45CM (ENDOMECHANICALS) ×2
SUT VICRYL 0 UR6 27IN ABS (SUTURE) ×2 IMPLANT
SUT VICRYL 4-0 PS2 18IN ABS (SUTURE) ×2 IMPLANT
SYS BAG RETRIEVAL 10MM (BASKET) ×2
SYSTEM BAG RETRIEVAL 10MM (BASKET) ×1 IMPLANT
TOWEL GREEN STERILE FF (TOWEL DISPOSABLE) ×4 IMPLANT
TRAY FOLEY W/BAG SLVR 14FR (SET/KITS/TRAYS/PACK) ×2 IMPLANT
TRENDGUARD 450 HYBRID PRO PACK (MISCELLANEOUS) ×2
TROCAR BALLN 12MMX100 BLUNT (TROCAR) ×2 IMPLANT
WARMER LAPAROSCOPE (MISCELLANEOUS) ×2 IMPLANT

## 2022-09-11 NOTE — Anesthesia Postprocedure Evaluation (Signed)
Anesthesia Post Note  Patient: Beth Hansen  Procedure(s) Performed: LAPAROSCOPIC LEFT SALPINGECTOMY WITH REMOVAL OF ECTOPIC PREGNANCY (Left)     Patient location during evaluation: PACU Anesthesia Type: General Level of consciousness: awake and alert Pain management: pain level controlled Vital Signs Assessment: post-procedure vital signs reviewed and stable Respiratory status: spontaneous breathing, nonlabored ventilation, respiratory function stable and patient connected to nasal cannula oxygen Cardiovascular status: blood pressure returned to baseline and stable Postop Assessment: no apparent nausea or vomiting Anesthetic complications: no  No notable events documented.  Last Vitals:  Vitals:   09/11/22 0900 09/11/22 0915  BP: 115/75   Pulse: 86 89  Resp: 14 14  Temp:    SpO2: 98% 99%    Last Pain:  Vitals:   09/11/22 0700  TempSrc:   PainSc: Asleep                 Effie Berkshire

## 2022-09-11 NOTE — ED Notes (Signed)
Report given to Vicente Males, RN for MAU at [facility].

## 2022-09-11 NOTE — Transfer of Care (Signed)
Immediate Anesthesia Transfer of Care Note  Patient: Beth Hansen  Procedure(s) Performed: LAPAROSCOPIC LEFT SALPINGECTOMY WITH REMOVAL OF ECTOPIC PREGNANCY (Left)  Patient Location: PACU  Anesthesia Type:General  Level of Consciousness: drowsy and responds to stimulation  Airway & Oxygen Therapy: Patient Spontanous Breathing  Post-op Assessment: Report given to RN and Post -op Vital signs reviewed and stable  Post vital signs: Reviewed and stable  Last Vitals:  Vitals Value Taken Time  BP 134/76 09/11/22 0625  Temp    Pulse 101 09/11/22 0628  Resp 20 09/11/22 0628  SpO2 97 % 09/11/22 0628  Vitals shown include unvalidated device data.  Last Pain:  Vitals:   09/11/22 0125  TempSrc: Oral  PainSc: 10-Worst pain ever         Complications: No notable events documented.

## 2022-09-11 NOTE — MAU Note (Signed)
0126- Patient arrived via [facility] to room 21.   Beth Hansen is a 37 y.o. at Unknown here in MAU reporting:  Left sided abdominal, flank and back pain that she describes as "sharp, and lightening" that started 3 days ago. She reports going to urgent care where she received a + UPT, and was sent to [facility].  Patient reports having a tubal reversal three years ago, and reports having a D&C a year later.   Patient denies VB.   LMP: 08/09/22 Onset of complaint: 09/08/22 Pain score: 7/10 - L abd, flank, back Vitals:   09/11/22 0125 09/11/22 0129  BP: 113/67   Pulse: 91   Resp: 17   Temp: 97.7 F (36.5 C)   SpO2: 99% 99%     FHT:n/a Lab orders placed from triage:

## 2022-09-11 NOTE — Anesthesia Preprocedure Evaluation (Signed)
Anesthesia Evaluation  Patient identified by MRN, date of birth, ID band Patient awake    Reviewed: Allergy & Precautions, NPO status , Patient's Chart, lab work & pertinent test results  Airway Mallampati: I  TM Distance: >3 FB Neck ROM: Full    Dental  (+) Dental Advisory Given, Chipped,    Pulmonary    breath sounds clear to auscultation       Cardiovascular negative cardio ROS  Rhythm:Regular Rate:Normal     Neuro/Psych negative neurological ROS  negative psych ROS   GI/Hepatic negative GI ROS, Neg liver ROS,,,  Endo/Other  negative endocrine ROS    Renal/GU negative Renal ROS     Musculoskeletal negative musculoskeletal ROS (+)    Abdominal   Peds  Hematology negative hematology ROS (+)   Anesthesia Other Findings   Reproductive/Obstetrics                             Anesthesia Physical Anesthesia Plan  ASA: 2 and emergent  Anesthesia Plan: General   Post-op Pain Management:    Induction: Intravenous  PONV Risk Score and Plan: 4 or greater and Ondansetron, Dexamethasone, Midazolam, Scopolamine patch - Pre-op and Treatment may vary due to age or medical condition  Airway Management Planned: Oral ETT  Additional Equipment: None  Intra-op Plan:   Post-operative Plan: Extubation in OR  Informed Consent: I have reviewed the patients History and Physical, chart, labs and discussed the procedure including the risks, benefits and alternatives for the proposed anesthesia with the patient or authorized representative who has indicated his/her understanding and acceptance.     Dental advisory given  Plan Discussed with: CRNA  Anesthesia Plan Comments: (Lab Results      Component                Value               Date                      WBC                      6.1                 09/10/2022                HGB                      13.1                09/10/2022                 HCT                      39.6                09/10/2022                MCV                      83.9                09/10/2022                PLT                      289  09/10/2022           )       Anesthesia Quick Evaluation

## 2022-09-11 NOTE — H&P (Addendum)
[facility] PRACTICE PRE OPERATIVE HISTORY AND PHYSICAL NOTE MAU Note  History of Present Illness: Beth Hansen is a 37 y.o. G3P2002 at 67w6dby LMP 08[facility]08/[facility].   Hx notable for BTL and tubal reversal. Pt presented initially to urgent care 3/7 with 3 days of sharp LLQ pain. Had a +UPT presented to ED for further eval. In ED, pt was afebrile, HDS. Abdominal exam notable for LLQ tenderness and voluntary guarding. Hgb 13.1, plt 289, CMP & lipase WNL, UA +LE.    BhCG 739 Pelvic [place]/ 6.5 x 3 x 5.2cm heterogenous L adnexal mass c/f ectopic pregnancy or hematoma w/ moderate to large complex free fluid concerning for hemoperitoneum. I personally reviewed these images and agree with finding of moderate volume hemoperitoneum in the left adnexa.   Given these findings, the patient was transferred to MAU for further evaluation and management. She confirms the above history.   NPO since 3/7 AM.  Past Medical History:  Diagnosis Date   Anemia    Past Surgical History:  Procedure Laterality Date   IUD REMOVAL N/A 12/06/2013   Procedure: INTRAUTERINE DEVICE (IUD) REMOVAL;  Surgeon: LFlorian Buff MD;  Location: [facility];  Service: Gynecology;  Laterality: N/A;   LAPAROSCOPIC TUBAL LIGATION Bilateral 12/06/2013   Procedure: LAPAROSCOPIC BILATERAL TUBAL CAUTERIZATION USING ELECTROCAUTERY;  Surgeon: LFlorian Buff MD;  Location: [facility];  Service: Gynecology;  Laterality: Bilateral;   tubal reversal  03/03/2020   WISDOM TOOTH EXTRACTION     OB History  Gravida Para Term Preterm AB Living  '3 2 2 '$ 0 0 2  SAB IAB Ectopic Multiple Live Births  0 0 0 0 2    # Outcome Date GA Lbr Len/2nd Weight Sex Delivery Anes PTL Lv  3 Current           2 Term 12/26/11 351w2d8:56 / 00:31 3085 g M Vag-Spont EPI  LIV  1 Term 2011 417w0d:00 2722 g F Vag-Spont EPI N LIV   Social History   Socioeconomic History   Marital status: Significant Other     Spouse name: Not on file   Number of children: 2   Years of education: Not on file   Highest education level: Not on file  Occupational History   Not on file  Tobacco Use   Smoking status: Never   Smokeless tobacco: Never  Vaping Use   Vaping Use: Never used  Substance and Sexual Activity   Alcohol use: Yes    Comment: occ   Drug use: No   Sexual activity: Not Currently    Birth control/protection: Abstinence  Other Topics Concern   Not on file  Social History Narrative   Not on file   Social Determinants of Health   Financial Resource Strain: Low Risk  (04/28/2022)   Overall Financial Resource Strain (CARDIA)    Difficulty of Paying Living Expenses: Not very hard  Food Insecurity: No Food Insecurity (04/28/2022)   Hunger Vital Sign    Worried About Running Out of Food in the Last Year: Never true    Ran Out of Food in the Last Year: Never true  Transportation Needs: No Transportation Needs (04/28/2022)   PRAPARE - TraHydrologistedical): No    Lack of Transportation (Non-Medical): No  Physical Activity: Sufficiently Active (04/28/2022)   Exercise Vital Sign    Days of Exercise per Week: 6 days  Minutes of Exercise per Session: 90 min  Stress: No Stress Concern Present (04/28/2022)   [place]    Feeling of Stress : Only a little  Social Connections: Moderately Integrated (04/28/2022)   Social Connection and Isolation Panel [NHANES]    Frequency of Communication with Friends and Family: More than three times a week    Frequency of Social Gatherings with Friends and Family: Twice a week    Attends Religious Services: More than 4 times per year    Active Member of [employer] or Organizations: Yes    Attends Music therapist: More than 4 times per year    Marital Status: Divorced   Family History  Problem Relation Age of Onset   Diabetes Maternal Grandmother     Hypertension Maternal Grandmother    Hypertension Father    Other Neg Hx    Allergies  Allergen Reactions   Latex Rash   Medications Prior to Admission  Medication Sig Dispense Refill Last Dose   desogestrel-ethinyl estradiol (MIRCETTE) 0.15-0.02/0.01 MG (21/5) tablet Take 1 tablet by mouth daily. 28 tablet 11    Review of Systems - Negative except as noted in the HPI  Vitals:  BP 113/67   Pulse 91   Temp 97.7 F (36.5 C) (Oral)   Resp 17   Ht '5\' 8"'$  (1.727 m)   Wt 96.6 kg   LMP 08/09/2022 (Exact Date)   SpO2 99%   BMI 32.39 kg/m  Physical Examination: CONSTITUTIONAL: Well-developed, well-nourished female in no acute distress.  SKIN: Skin is warm and dry. No rash noted. Not diaphoretic. No erythema. No pallor. NEUROLOGIC: Alert and oriented to person, place, and time. PSYCHIATRIC: Normal mood and affect. Normal behavior. Normal judgment and thought content. CARDIOVASCULAR: Normal heart rate noted, regular rhythm RESPIRATORY: Effort normal, no problems with respiration noted ABDOMEN: Soft, nontender, nondistended, gravid. MUSCULOSKELETAL: Normal range of motion. No edema and no tenderness. 2+ distal pulses.  Labs:  Results for orders placed or performed during the hospital encounter of 09/10/22 (from the past 24 hour(s))  Lipase, blood   Collection Time: 09/10/22  7:51 PM  Result Value Ref Range   Lipase <10 (L) 11 - 51 U/L  Comprehensive metabolic panel   Collection Time: 09/10/22  7:51 PM  Result Value Ref Range   Sodium 138 135 - 145 mmol/L   Potassium 3.7 3.5 - 5.1 mmol/L   Chloride 106 98 - 111 mmol/L   CO2 24 22 - 32 mmol/L   Glucose, Bld 88 70 - 99 mg/dL   BUN 6 6 - 20 mg/dL   Creatinine, Ser 0.72 0.44 - 1.00 mg/dL   Calcium 9.3 8.9 - 10.3 mg/dL   Total Protein 7.9 6.5 - 8.1 g/dL   Albumin 3.8 3.5 - 5.0 g/dL   AST 83 (H) 15 - 41 U/L   ALT 39 0 - 44 U/L   Alkaline Phosphatase 50 38 - 126 U/L   Total Bilirubin 0.4 0.3 - 1.2 mg/dL   GFR, Estimated >60 >60  mL/min   Anion gap 8 5 - 15  CBC   Collection Time: 09/10/22  7:51 PM  Result Value Ref Range   WBC 6.1 4.0 - 10.5 K/uL   RBC 4.72 3.87 - 5.11 MIL/uL   Hemoglobin 13.1 12.0 - 15.0 g/dL   HCT 39.6 36.0 - 46.0 %   MCV 83.9 80.0 - 100.0 fL   MCH 27.8 26.0 - 34.0 pg  MCHC 33.1 30.0 - 36.0 g/dL   RDW 13.1 11.5 - 15.5 %   Platelets 289 150 - 400 K/uL   nRBC 0.0 0.0 - 0.2 %  Urinalysis, Routine w reflex microscopic -Urine, Clean Catch   Collection Time: 09/10/22  7:51 PM  Result Value Ref Range   Color, Urine YELLOW YELLOW   APPearance CLEAR CLEAR   Specific Gravity, Urine 1.030 1.005 - 1.030   pH 6.5 5.0 - 8.0   Glucose, UA NEGATIVE NEGATIVE mg/dL   Hgb urine dipstick NEGATIVE NEGATIVE   Bilirubin Urine NEGATIVE NEGATIVE   Ketones, ur NEGATIVE NEGATIVE mg/dL   Protein, ur TRACE (A) NEGATIVE mg/dL   Nitrite NEGATIVE NEGATIVE   Leukocytes,Ua LARGE (A) NEGATIVE   RBC / HPF 0-5 0 - 5 RBC/hpf   WBC, UA 6-10 0 - 5 WBC/hpf   Bacteria, UA NONE SEEN NONE SEEN   Squamous Epithelial / HPF 11-20 0 - 5 /HPF   Mucus PRESENT   Pregnancy, urine   Collection Time: 09/10/22  7:51 PM  Result Value Ref Range   Preg Test, Ur POSITIVE (A) NEGATIVE  hCG, quantitative, pregnancy   Collection Time: 09/10/22  7:56 PM  Result Value Ref Range   hCG, Beta Chain, Quant, S 739 (H) <5 mIU/mL   Imaging Studies: US OB LESS THAN 14 WEEKS WITH OB TRANSVAGINAL  Result Date: 09/11/2022 CLINICAL DATA:  [id number], [id number]. Positive pregnancy test with left lower quadrant pain. Beta HCG 739. EXAM: OBSTETRIC <14 WK [place] AND TRANSVAGINAL OB US DOPPLER ULTRASOUND OF OVARIES TECHNIQUE: Both transabdominal and transvaginal ultrasound examinations were performed for complete evaluation of the gestation as well as the maternal uterus, adnexal regions, and pelvic cul-de-sac. Transvaginal technique was performed to assess early pregnancy. Color and duplex Doppler ultrasound was utilized to evaluate blood flow to the ovaries.  COMPARISON:  None Available. FINDINGS: Intrauterine gestational sac: None. Yolk sac:  Not visualized. Embryo:  Not visualized. Cardiac Activity: N/a. MSD: None. CRL: None. Subchorionic hemorrhage:  None visualized. Maternal uterus/adnexae: There is a moderate to large amount of complex pelvic free fluid concerning for hemoperitoneum. There is an anteverted uterus measuring 9.5 x 5.7 by 5.7 cm. No wall mass is seen. The endometrium is thickened measuring 2 cm but it is normally circumscribed. There is a small subcentimeter nabothian cyst in the otherwise unremarkable cervix. On the transabdominal scan, in the left adnexa there is a heterogeneous 6.5 x 3 x 5.2 cm mass which appears separate from the ovary, with scant questionable color flow at the periphery. This could represent an ectopic gestation or hematoma. The left ovary is best seen on the endovaginal scan where it measures 2.5 x 1.8 x 2.8 cm and is unremarkable. There are scant subcentimeter simple follicles in the right ovary but no abnormality. The right ovary measures 2.8 x 2.1 x 2.7 cm. Color doppler evaluation of both ovaries demonstrates normal appearing color flow. IMPRESSION: 1. There is a 6.5 x 3 x 5.2 cm heterogeneous left adnexal mass on the transabdominal scan suspicious for either an ectopic pregnancy or a hematoma. 2. Moderate to large volume of complex free fluid worrisome for hemoperitoneum such as due to ruptured ectopic pregnancy. Appropriate action should be taken. 3. No intrauterine gestational sac. 4. Discussed over the phone with PA Dallie Piles at 12:07 a.m., 09/11/2022, with verbal acknowledgement of key findings. Electronically Signed   By: Telford Nab M.D.   On: 09/11/2022 00:14    Assessment and Plan: Patient Active Problem List  Diagnosis Date Noted   Ruptured ectopic pregnancy 09/11/2022   Given her imaging findings, recommended diagnostic laparoscopy with possible unilateral salpingectomy, possible unilateral  oophorectomy, evacuation of hemoperitoneum and additional procedures as indicated for treatment of suspected ruptured ectopic pregnancy. Reviewed risks of surgery include but are not limited to infection, bleeding requiring transfusion or hysterectomy, injury to surrounding organs (I.e., bowel, bladder, ureters, major blood vessels), need for additional procedures, laparotomy, VTE, anesthesia complications, stroke/MI/death. Reviewed risks of expectant management and MTX including worsening of hemoperitoneum or death.  After our discussion, Beth Hansen is in agreement with plan for surgical management. Consents signed.  Pre op abx not indicated.  OR notified.   Harvie Bridge, MD Obstetrician & Gynecologist, Faculty Practice Faculty Practice, [facility]  --- ADDENDUM ----  In reviewing patients chart for her post op visit, I see I did not update her abdominal exam correctly. When I evaluated the patient in the MAU, the patient had moderate lower abdominal tenderness with guarding and was unable to straighten her legs all concerning for a peritonitic abdomen  Harvie Bridge, MD Obstetrician & Gynecologist, [facility] for [facility], [facility]

## 2022-09-11 NOTE — Op Note (Addendum)
Shawnta B Bolanos PROCEDURE DATE: 09/11/2022  PREOPERATIVE DIAGNOSIS: Ruptured ectopic pregnancy POSTOPERATIVE DIAGNOSIS: Ruptured left fallopian tube ectopic pregnancy PROCEDURE: Diagnostic laparoscopy, laparoscopic left salpingectomy and removal of ectopic pregnancy SURGEON:  Dr. Gale Journey ANESTHESIOLOGY TEAM: Anesthesiologist: Effie Berkshire, MD CRNA: Suzy Bouchard, CRNA  INDICATIONS: 37 y.o. [phone] at 83w5dhere with the preoperative diagnoses as listed above.  Please refer to preoperative notes for more details. Patient was counseled regarding need for diagnostic laparoscopy, laparoscopic salpingectomy. Risks of surgery including bleeding which may require transfusion or reoperation, infection, injury to bowel or other surrounding organs, need for additional procedures including laparotomy and other postoperative/anesthesia complications were explained to patient.  Written informed consent was obtained.  FINDINGS:  Moderate amount of hemoperitoneum estimated to be about 300cc of blood and clot. 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 Left tube with blood and clot at the fimbriae & small amount of slow active bleeding consistent with ruptured tubal ectopic.  Right tube with adhesive disease between uterus & pelvic sidewall.   ANESTHESIA: General INTRAVENOUS FLUIDS: 1600 ml ESTIMATED BLOOD LOSS: 100 ml URINE OUTPUT: 150 ml SPECIMENS: Left fallopian tube  COMPLICATIONS: None immediate  PROCEDURE IN DETAIL:  The patient was taken to the operating room where general anesthesia was administered and was found to be adequate.  She was placed in the dorsal lithotomy position, and was prepped and draped in a sterile manner.  A Foley catheter was inserted into her bladder and attached to constant drainage.  She was prepped and draped in the usual sterile fashion in the dorsal lithotomy position. In Hasson fashion, an infraumbilical  incision was made, fascia identified with blunt dissection, the fascia was divided and trocar placed.. Below the point of entry and the pelvis was inspected and there was no evidence of injury. Hemoperitoneum noted in the pelvis. She was placed in steep Trendelenburg.  The scalpel was then used to make two 583mincisions, one in the LLQ and one in the suprapubic area.  Two 41m83morts were inserted under direct visualization.   The suction irrigator was then used to suction the hemoperitoneum.  Attention was then turned to the left fallopian tube. Blunt dissection was used to separate the sigmoid from the tube. Bleeding noted within the fallopian tube which was cauterized with the Ligasure device. At this point, it was apparent that a skilled assistant was necessary for adequate dissection/retraction/visualization and Dr. DunDamita Dunningss called to assist. A Hulka uterine manipulator was placed. An additional RLQ 41mm2mrt was placed under direct visualization.  A combination of blunt and sharp dissection was used to develop a plane between the uterus, fallopian tube, and sigmoid colon. The tube was isolated and transected with the Ligasure device. A raytec was used to apply pressure to areas of slow oozing. Hemostasis achieved. The raytec was removed from the abdomen. The specimen was placed in an EndoCatch bag and removed from the abdomen intact. Arista was applied and again good hemostasis was noted.   The abdomen was desufflated, and all instruments were removed.    The umbilical fascia was closed with a single stitch of 0-vicryl. The incisions were then closed in a subcuticular fashion with 4-0 monocryl and dressings were placed. She was taken to recovery in stable condition.  Rh status:  negative  - rhogam not indicated given gestational age < 13wk   The patient will be discharged to home as per PACU criteria.  Routine postoperative instructions  given. We discussed risks/benefits of prophylactic methotrexate  given adhesive disease may have limited our ability to remove all pregnancy tissue. We ultimately opted to trend BhCG and treat as needed. She will follow up within 1 week for BhCG. She was prescribed oxycodone, ibuprofen, tylenol, and senna.    Gale Journey, MD [place], [facility] for [employer], [place]

## 2022-09-11 NOTE — ED Notes (Signed)
Report given to [facility]. 

## 2022-09-11 NOTE — ED Notes (Signed)
Called [facility] to transport patient to [facility]--Dr. Mardee Postin accepting

## 2022-09-11 NOTE — Anesthesia Procedure Notes (Signed)
Procedure Name: Intubation Date/Time: 09/11/2022 4:07 AM  Performed by: Suzy Bouchard, CRNAPre-anesthesia Checklist: Patient identified, Emergency Drugs available, Suction available, Patient being monitored and Timeout performed Patient Re-evaluated:Patient Re-evaluated prior to induction Oxygen Delivery Method: Circle system utilized Preoxygenation: Pre-oxygenation with 100% oxygen Induction Type: IV induction Laryngoscope Size: Miller and 2 Grade View: Grade I Tube type: Oral Tube size: 7.0 mm Number of attempts: 1 Airway Equipment and Method: Stylet Placement Confirmation: ETT inserted through vocal cords under direct vision, positive ETCO2 and breath sounds checked- equal and bilateral Secured at: 22 cm Tube secured with: Tape Dental Injury: Teeth and Oropharynx as per pre-operative assessment

## 2022-09-11 NOTE — Brief Op Note (Signed)
09/11/2022  6:10 AM  PATIENT:  Beth Hansen  37 y.o. female  PRE-OPERATIVE DIAGNOSIS:  ruptured ectopic pregnancy  POST-OPERATIVE DIAGNOSIS:  RUPTURED LEFT ECTOPIC PREGNANCY  PROCEDURE:  Procedure(s): LAPAROSCOPIC LEFT SALPINGECTOMY WITH REMOVAL OF ECTOPIC PREGNANCY (Left)  SURGEON:  Surgeon(s) and Role:    * Inez Catalina, MD - Primary    * Radene Gunning, MD - Assisting  ANESTHESIA:   general  EBL:  100 mL   BLOOD ADMINISTERED:none  DRAINS: none   LOCAL MEDICATIONS USED:  NONE  SPECIMEN:  left fallopian tube  DISPOSITION OF SPECIMEN:  PATHOLOGY  COUNTS:  YES  TOURNIQUET:  * No tourniquets in log *  DICTATION: .Note written in EPIC  PLAN OF CARE: Discharge to home after PACU  PATIENT DISPOSITION:  PACU - hemodynamically stable.   Delay start of Pharmacological VTE agent (>24hrs) due to surgical blood loss or risk of bleeding: no

## 2022-09-12 ENCOUNTER — Encounter (HOSPITAL_COMMUNITY): Payer: Self-pay | Admitting: Obstetrics and Gynecology

## 2022-09-12 LAB — URINE CULTURE: Culture: NO GROWTH

## 2022-09-13 ENCOUNTER — Encounter: Payer: Self-pay | Admitting: Obstetrics and Gynecology

## 2022-09-14 ENCOUNTER — Inpatient Hospital Stay (HOSPITAL_COMMUNITY): Payer: BC Managed Care – PPO

## 2022-09-14 ENCOUNTER — Telehealth: Payer: Self-pay | Admitting: Family Medicine

## 2022-09-14 ENCOUNTER — Inpatient Hospital Stay (HOSPITAL_COMMUNITY)
Admission: AD | Admit: 2022-09-14 | Discharge: 2022-09-14 | Disposition: A | Discharge status: Home / Self Care | Payer: BC Managed Care – PPO | Attending: Family Medicine | Admitting: Family Medicine

## 2022-09-14 DIAGNOSIS — Z3A01 Less than 8 weeks gestation of pregnancy: Secondary | ICD-10-CM | POA: Insufficient documentation

## 2022-09-14 DIAGNOSIS — O09521 Supervision of elderly multigravida, first trimester: Secondary | ICD-10-CM | POA: Diagnosis not present

## 2022-09-14 DIAGNOSIS — O09291 Supervision of pregnancy with other poor reproductive or obstetric history, first trimester: Secondary | ICD-10-CM | POA: Diagnosis present

## 2022-09-14 DIAGNOSIS — O00109 Unspecified tubal pregnancy without intrauterine pregnancy: Secondary | ICD-10-CM | POA: Diagnosis not present

## 2022-09-14 LAB — CBC
HCT: 34 % — ABNORMAL LOW (ref 36.0–46.0)
Hemoglobin: 11.2 g/dL — ABNORMAL LOW (ref 12.0–15.0)
MCH: 28.1 pg (ref 26.0–34.0)
MCHC: 32.9 g/dL (ref 30.0–36.0)
MCV: 85.2 fL (ref 80.0–100.0)
Platelets: 269 10*3/uL (ref 150–400)
RBC: 3.99 MIL/uL (ref 3.87–5.11)
RDW: 13 % (ref 11.5–15.5)
WBC: 5.5 10*3/uL (ref 4.0–10.5)
nRBC: 0 % (ref 0.0–0.2)

## 2022-09-14 LAB — COMPREHENSIVE METABOLIC PANEL
ALT: 50 U/L — ABNORMAL HIGH (ref 0–44)
AST: 60 U/L — ABNORMAL HIGH (ref 15–41)
Albumin: 3.3 g/dL — ABNORMAL LOW (ref 3.5–5.0)
Alkaline Phosphatase: 54 U/L (ref 38–126)
Anion gap: 4 — ABNORMAL LOW (ref 5–15)
BUN: 7 mg/dL (ref 6–20)
CO2: 29 mmol/L (ref 22–32)
Calcium: 8.5 mg/dL — ABNORMAL LOW (ref 8.9–10.3)
Chloride: 103 mmol/L (ref 98–111)
Creatinine, Ser: 0.83 mg/dL (ref 0.44–1.00)
GFR, Estimated: >60 mL/min (ref >60)
Glucose, Bld: 92 mg/dL (ref 70–99)
Potassium: 3.5 mmol/L (ref 3.5–5.1)
Sodium: 136 mmol/L (ref 135–145)
Total Bilirubin: 0.6 mg/dL (ref 0.3–1.2)
Total Protein: 7.2 g/dL (ref 6.5–8.1)

## 2022-09-14 LAB — HCG, QUANTITATIVE, PREGNANCY: hCG, Beta Chain, Quant, S: 118 m[IU]/mL — ABNORMAL HIGH (ref <5)

## 2022-09-14 LAB — SURGICAL PATHOLOGY

## 2022-09-14 MED ORDER — RHO D IMMUNE GLOBULIN 1500 UNIT/2ML IJ SOSY
300.0000 ug | PREFILLED_SYRINGE | Freq: Once | INTRAMUSCULAR | Status: AC
  Administered 2022-09-14: 300 ug via INTRAMUSCULAR
  Filled 2022-09-14: qty 2

## 2022-09-14 MED ORDER — METHOTREXATE FOR ECTOPIC PREGNANCY
50.0000 mg/m2 | Freq: Once | INTRAMUSCULAR | Status: AC
  Administered 2022-09-14: 110 mg via INTRAMUSCULAR
  Filled 2022-09-14: qty 4.4

## 2022-09-14 MED ORDER — OXYCODONE HCL 5 MG PO TABS
5.0000 mg | ORAL_TABLET | Freq: Once | ORAL | Status: AC
  Administered 2022-09-14: 5 mg via ORAL
  Filled 2022-09-14: qty 1

## 2022-09-14 NOTE — MAU Note (Signed)
.  Beth Hansen is a 37 y.o. at [redacted]w[redacted]d here in MAU reporting: OB Kennon Rounds and Blanchard Mane called for pt to come in for lab and [place] post surgery due to possible pregnancy. Tubal removal for ectopic pregnancy, however, biopsy did not show the ectopic in the tube. Pt report cramping in lower back and ABD, and pt has taken prescribed pain medication. Pt reports some light VB.   Onset of complaint: Friday  Pain score: 8/10 Vitals:   09/14/22 1927  BP: (!) 115/59  Pulse: 63  Resp: 18  Temp: 98.3 F (36.8 C)  SpO2: 100%      Lab orders placed from triage:

## 2022-09-14 NOTE — Telephone Encounter (Signed)
pathology does not show ectopic--patient reports bleeding and pain--will repeat her HCG if dropping, does not need anything else. if stable, give MTX

## 2022-09-14 NOTE — MAU Provider Note (Signed)
History     CSN: [id number]  Arrival date and time: 09/14/22 1856   Event Date/Time   First Provider Initiated Contact with Patient 09/14/22 1933      No chief complaint on file.  HPI  Beth Hansen is a 37 y.o. [id number] at 9w5dwho presents for evaluation of abdominal pain. Patient reports since she went home from her surgery, she has been having sharp abdominal pain. Patient rates the pain as a 10/10 and has tried oxycodone for the pain with relief.    She was called by Dr. PKennon Roundstoday and informed that her pathology report did not show ectopic pregnancy and that she needed further evaluation.   OB History     Gravida  3   Para  2   Term  2   Preterm  0   AB  1   Living  2      SAB  0   IAB  0   Ectopic  1   Multiple  0   Live Births  2           Past Medical History:  Diagnosis Date   Anemia     Past Surgical History:  Procedure Laterality Date   IUD REMOVAL N/A 12/06/2013   Procedure: INTRAUTERINE DEVICE (IUD) REMOVAL;  Surgeon: LFlorian Buff MD;  Location: [facility];  Service: Gynecology;  Laterality: N/A;   LAPAROSCOPIC TUBAL LIGATION Bilateral 12/06/2013   Procedure: LAPAROSCOPIC BILATERAL TUBAL CAUTERIZATION USING ELECTROCAUTERY;  Surgeon: LFlorian Buff MD;  Location: [facility];  Service: Gynecology;  Laterality: Bilateral;   LAPAROSCOPIC UNILATERAL SALPINGECTOMY Left 09/11/2022   Procedure: LAPAROSCOPIC LEFT SALPINGECTOMY WITH REMOVAL OF ECTOPIC PREGNANCY;  Surgeon: FInez Catalina MD;  [facility]  Service: Gynecology;  Laterality: Left;   tubal reversal  03/03/2020   WISDOM TOOTH EXTRACTION      Family History  Problem Relation Age of Onset   Diabetes Maternal Grandmother    Hypertension Maternal Grandmother    Hypertension Father    Other Neg Hx     Social History   Tobacco Use   Smoking status: Never   Smokeless tobacco: Never  Vaping Use   Vaping Use: Never used  Substance Use Topics   Alcohol use: Yes    Comment:  occ   Drug use: No    Allergies:  Allergies  Allergen Reactions   Latex Rash    Medications Prior to Admission  Medication Sig Dispense Refill Last Dose   acetaminophen (TYLENOL) 500 MG tablet Take 2 tablets (1,000 mg total) by mouth every 8 (eight) hours. 60 tablet 0    desogestrel-ethinyl estradiol (MIRCETTE) 0.15-0.02/0.01 MG (21/5) tablet Take 1 tablet by mouth daily. 28 tablet 11    ibuprofen (ADVIL) 800 MG tablet Take 1 tablet (800 mg total) by mouth every 8 (eight) hours. 60 tablet 0    oxyCODONE (ROXICODONE) 5 MG immediate release tablet Take 1 tablet (5 mg total) by mouth every 4 (four) hours as needed for breakthrough pain. 10 tablet 0    senna-docusate (SENOKOT-S) 8.6-50 MG tablet Take 1 tablet by mouth daily. 30 tablet 0     Review of Systems  Constitutional: Negative.  Negative for fatigue and fever.  HENT: Negative.    Respiratory: Negative.  Negative for shortness of breath.   Cardiovascular: Negative.  Negative for chest pain.  Gastrointestinal:  Positive for abdominal pain. Negative for constipation, diarrhea, nausea and vomiting.  Genitourinary: Negative.  Negative for dysuria,  vaginal bleeding and vaginal discharge.  Neurological: Negative.  Negative for dizziness and headaches.   Physical Exam   Blood pressure (!) 115/59, pulse 63, temperature 98.3 F (36.8 C), temperature source Oral, resp. rate 18, height '5\' 8"'$  (1.727 m), weight 99.3 kg, last menstrual period 08/09/2022, SpO2 100 %.  Patient Vitals for the past 24 hrs:  BP Temp Temp src Pulse Resp SpO2 Height Weight  09/14/22 1927 (!) 115/59 98.3 F (36.8 C) Oral 63 18 100 % -- --  09/14/22 1923 -- -- -- -- -- -- '5\' 8"'$  (1.727 m) 99.3 kg    Physical Exam Vitals and nursing note reviewed.  Constitutional:      General: She is not in acute distress.    Appearance: She is well-developed.  HENT:     Head: Normocephalic.  Eyes:     Pupils: Pupils are equal, round, and reactive to light.   Cardiovascular:     Rate and Rhythm: Normal rate and regular rhythm.     Heart sounds: Normal heart sounds.  Pulmonary:     Effort: Pulmonary effort is normal. No respiratory distress.     Breath sounds: Normal breath sounds.  Abdominal:     General: Bowel sounds are normal. There is no distension.     Palpations: Abdomen is soft.     Tenderness: There is no abdominal tenderness.  Skin:    General: Skin is warm and dry.  Neurological:     Mental Status: She is alert and oriented to person, place, and time.  Psychiatric:        Mood and Affect: Mood normal.        Behavior: Behavior normal.        Thought Content: Thought content normal.        Judgment: Judgment normal.      MAU Course  Procedures  Results for orders placed or performed during the hospital encounter of 09/14/22 (from the past 24 hour(s))  CBC     Status: Abnormal   Collection Time: 09/14/22  7:46 PM  Result Value Ref Range   WBC 5.5 4.0 - 10.5 K/uL   RBC 3.99 3.87 - 5.11 MIL/uL   Hemoglobin 11.2 (L) 12.0 - 15.0 g/dL   HCT 34.0 (L) 36.0 - 46.0 %   MCV 85.2 80.0 - 100.0 fL   MCH 28.1 26.0 - 34.0 pg   MCHC 32.9 30.0 - 36.0 g/dL   RDW 13.0 11.5 - 15.5 %   Platelets 269 150 - 400 K/uL   nRBC 0.0 0.0 - 0.2 %  Comprehensive metabolic panel     Status: Abnormal   Collection Time: 09/14/22  7:46 PM  Result Value Ref Range   Sodium 136 135 - 145 mmol/L   Potassium 3.5 3.5 - 5.1 mmol/L   Chloride 103 98 - 111 mmol/L   CO2 29 22 - 32 mmol/L   Glucose, Bld 92 70 - 99 mg/dL   BUN 7 6 - 20 mg/dL   Creatinine, Ser 0.83 0.44 - 1.00 mg/dL   Calcium 8.5 (L) 8.9 - 10.3 mg/dL   Total Protein 7.2 6.5 - 8.1 g/dL   Albumin 3.3 (L) 3.5 - 5.0 g/dL   AST 60 (H) 15 - 41 U/L   ALT 50 (H) 0 - 44 U/L   Alkaline Phosphatase 54 38 - 126 U/L   Total Bilirubin 0.6 0.3 - 1.2 mg/dL   GFR, Estimated >60 >60 mL/min   Anion gap 4 (L) 5 - 15  hCG, quantitative, pregnancy     Status: Abnormal   Collection Time: 09/14/22  7:46 PM   Result Value Ref Range   hCG, Beta Chain, Quant, S 118 (H) <5 mIU/mL  Rh IG workup (includes ABO/Rh)     Status: None (Preliminary result)   Collection Time: 09/14/22  7:46 PM  Result Value Ref Range   Gestational Age(Wks) 4    Unit Number [id number]    Blood Component Type RHIG    Unit division 00    Status of Unit ISSUED    Transfusion Status      OK TO TRANSFUSE Performed at [facility] [address] [place], [place] [address]      US PELVIC COMPLETE WITH TRANSVAGINAL  Result Date: 09/14/2022 CLINICAL DATA:  [id number] Abdominal pain [id number] EXAM: TRANSABDOMINAL AND TRANSVAGINAL ULTRASOUND OF PELVIS TECHNIQUE: Both transabdominal and transvaginal ultrasound examinations of the pelvis were performed. Transabdominal technique was performed for global imaging of the pelvis including uterus, ovaries, adnexal regions, and pelvic cul-de-sac. It was necessary to proceed with endovaginal exam following the transabdominal exam to visualize the ovaries. COMPARISON:  Ultrasound Ob 09/10/2022 FINDINGS: Uterus Measurements: 9.3 x 5.3 x 6 cm = volume 154 mL. No fibroids or other mass visualized. Endometrium Thickness: 4 mm.  No focal abnormality visualized. Right ovary Measurements: 2.8 x 1.5 x 2.7 cm = volume: 6.2 mL. Normal appearance. There is a 1.9 x 2.1 x 1.8 cm paraovarian cystic lesion with a vascular rim surrounding the lesion. The lesion is noted to be separate from the right ovary. Left ovary Measurements: 2.3 x 1.4 x 1.8 cm = volume: 3 mL. Normal appearance/no adnexal mass. Recently visualized left adnexal mass not visualized. Other findings No abnormal free fluid. Interval resolution of complex free fluid within the pelvis. IMPRESSION: 1. A 1.9 x 2.1 x 1.8 cm para ovarian cystic lesion with vascular rim that could represent a right adnexal ectopic pregnancy. No findings to suggest a ruptured ectopic. Recommend follow-up beta hCG to 0 as well as repeat ultrasound in 7-10 days. 2. Recently  visualized left adnexal mass not visualized. Interval resolution of complex free fluid within the pelvis. 3. No intrauterine pregnancy. These results were called by telephone at the time of interpretation on 09/14/2022 at 8:49 pm to provider Nicut , who verbally acknowledged these results. Electronically Signed   By: Iven Finn M.D.   On: 09/14/2022 21:12     MDM Labs ordered and reviewed.   CBC, CMP, HCG Rhogam work up [place] consulted with Dr. Harolyn Rutherford regarding presentation and results- MD recommends giving MTX tonight. Patient agreeable to plan of care.   The risks of methotrexate were reviewed including failure requiring repeat dosing or eventual surgery. She understands that methotrexate involves frequent return visits to monitor lab values and that she remains at risk of ectopic rupture until her beta is less than assay. ?The patient opts to proceed with methotrexate.  She has no history of hepatic or renal dysfunction, has normal BUN/Cr/LFT's/platelets.  She is felt to be reliable for follow-up. Side effects of photosensitivity & GI upset were discussed.  She knows to avoid direct sunlight and abstain from alcohol, NSAIDs and sexual intercourse for two weeks. She was counseled to discontinue any MVI with folic acid. ?She understands to follow up on D4 (3/15) and D7 (3/18) for repeat BHCG and was given the instruction sheet. ?Strict ectopic precautions were reviewed, the patient knows to call with any abdominal pain,  vomiting, fainting, or any concerns with her health.  Day 0/1 Day 4 Day 7  Sunday Wednesday Saturday  Monday Thursday Sunday  Tuesday Friday Monday  Wednesday Saturday Tuesday  Thursday Sunday Wednesday  Friday Monday Thursday  Saturday Tuesday Friday     Assessment and Plan   1. Tubal pregnancy without intrauterine pregnancy, unspecified laterality   2. [redacted] weeks gestation of pregnancy     -Discharge home in stable condition -Strict ectopic  precautions discussed -Patient advised to follow-up with [facility] on Friday for day 4 labs, urgent message sent to schedule day 7 labs -Patient may return to MAU as needed or if her condition were to change or worsen  Wende Mott, CNM 09/14/2022, 7:33 PM

## 2022-09-15 LAB — RH IG WORKUP (INCLUDES ABO/RH)
Gestational Age(Wks): 4
Unit division: 0

## 2022-09-16 ENCOUNTER — Other Ambulatory Visit: Payer: BC Managed Care – PPO

## 2022-09-17 ENCOUNTER — Encounter: Payer: Self-pay | Admitting: Obstetrics and Gynecology

## 2022-09-17 NOTE — Telephone Encounter (Signed)
It looks like patient's lab visit was cancelled for 3/15. She definitely needs this appointment. Can we please make sure her BhCG is drawn tomorrow? Thank you!! - KF

## 2022-09-18 ENCOUNTER — Ambulatory Visit (INDEPENDENT_AMBULATORY_CARE_PROVIDER_SITE_OTHER): Payer: BC Managed Care – PPO

## 2022-09-18 ENCOUNTER — Other Ambulatory Visit: Payer: BC Managed Care – PPO

## 2022-09-18 VITALS — BP 112/74 | HR 79 | Ht 68.0 in | Wt 206.0 lb

## 2022-09-18 DIAGNOSIS — Z3A01 Less than 8 weeks gestation of pregnancy: Secondary | ICD-10-CM

## 2022-09-18 DIAGNOSIS — O00109 Unspecified tubal pregnancy without intrauterine pregnancy: Secondary | ICD-10-CM

## 2022-09-18 LAB — BETA HCG QUANT (REF LAB): hCG Quant: 26 m[IU]/mL

## 2022-09-18 NOTE — Progress Notes (Addendum)
Pt reports for stat Hcg. Pt states that her pain has improved and she is still taking the Tylenol. Pt will return on 09/21/22 for repeat stat Hcg. All questions answered. No other concerns at this time.

## 2022-09-21 ENCOUNTER — Other Ambulatory Visit (HOSPITAL_COMMUNITY): Payer: BC Managed Care – PPO

## 2022-09-21 ENCOUNTER — Ambulatory Visit (INDEPENDENT_AMBULATORY_CARE_PROVIDER_SITE_OTHER): Payer: BC Managed Care – PPO

## 2022-09-21 VITALS — BP 111/76 | HR 64 | Wt 207.0 lb

## 2022-09-21 DIAGNOSIS — O00109 Unspecified tubal pregnancy without intrauterine pregnancy: Secondary | ICD-10-CM

## 2022-09-21 DIAGNOSIS — Z3A01 Less than 8 weeks gestation of pregnancy: Secondary | ICD-10-CM

## 2022-09-21 LAB — BETA HCG QUANT (REF LAB): hCG Quant: 12 m[IU]/mL

## 2022-09-21 NOTE — Progress Notes (Signed)
Subjective:     Beth Hansen is a 37 y.o. female who presents to the clinic 1 week status post   Diagnostic laparoscopy, laparoscopic left salpingectomy and removal of ectopic pregnancy .Patient is here for follow up of quantitative HCG, Day 7 MTX, and ongoing surveillance of pregnancy status. Eating a regular diet without green veggies and folic acid as instructed. Bowel movements are normal. Pain is controlled with Tylenol. Patient reports a "tearing/pulling" sensation at umbilicus  site.     Objective:   Labs: HCG Quant 118 on 09/14/22 HCG Quant 26 on 09/18/22 HCG Quant 12 on 09/21/22   BP 111/76   Pulse 64   Wt 207 lb (93.9 kg)   LMP 08/09/2022 (Exact Date)   BMI 31.47 kg/m  General:  alert  Abdomen: soft, non-tender  Incision:   Honeycomb dressing removed at umbilicus site, healing well, no drainage, no erythema, well approximated, no dehiscence, incision well approximated     Assessment:    Doing well postoperatively.   Plan:   1. Continue any current medications. 2. Wound care discussed. 3. Activity restrictions:  no heavy lifting, pulling, or strenuous exercises  4. Keep upcoming postop visit  5. Follow up x 1 week for repeat [place], CMA

## 2022-09-28 ENCOUNTER — Other Ambulatory Visit: Payer: BC Managed Care – PPO

## 2022-09-28 DIAGNOSIS — O00109 Unspecified tubal pregnancy without intrauterine pregnancy: Secondary | ICD-10-CM

## 2022-09-29 LAB — BETA HCG QUANT (REF LAB): hCG Quant: 1 m[IU]/mL

## 2022-10-12 ENCOUNTER — Encounter: Payer: Self-pay | Admitting: Obstetrics and Gynecology

## 2022-10-12 ENCOUNTER — Ambulatory Visit (INDEPENDENT_AMBULATORY_CARE_PROVIDER_SITE_OTHER): Payer: BC Managed Care – PPO | Admitting: Obstetrics and Gynecology

## 2022-10-12 VITALS — BP 119/78 | HR 78 | Ht 68.0 in | Wt 216.0 lb

## 2022-10-12 DIAGNOSIS — M6289 Other specified disorders of muscle: Secondary | ICD-10-CM

## 2022-10-12 DIAGNOSIS — Z09 Encounter for follow-up examination after completed treatment for conditions other than malignant neoplasm: Secondary | ICD-10-CM

## 2022-10-12 MED ORDER — DESOGESTREL-ETHINYL ESTRADIOL 0.15-0.02/0.01 MG (21/5) PO TABS: 1.0000 | ORAL_TABLET | Freq: Every day | ORAL | 11 refills | Status: AC

## 2022-10-12 MED ORDER — CYCLOBENZAPRINE HCL 10 MG PO TABS: 10.0000 mg | ORAL_TABLET | Freq: Three times a day (TID) | ORAL | 1 refills | Status: DC | PRN

## 2022-10-12 NOTE — Progress Notes (Signed)
POST-OPERATIVE GYNECOLOGY VISIT  Subjective:  Beth Hansen is a 37 y.o. [id number] who is 4 weeks s/p diagnostic laparoscopy and left salpingectomy for ruptured ectopic pregnancy.   Pt's post op course was c/b pathology without pregnancy tissue. She presented to MAU where BhCG had dropped from 739 > 118 and pelvic US showed resolution of left adnexal mass but new 1.9 x 2.1 x 1.8cm paraovarian cystic lesion with vascular rim c/f ?R adnexal ectopic pregnancy and no intrauterine pregnancy. She received MTX and BhCG was trended down to 1 on 09/28/22.   Since her surgery, she reports persistent back pain that radiates into her rectum and lower abdominal cramping. Gets spasms of sharp pain. Has had back pain like this before her surgery, but it has gotten worse. Is still taking tylenol & ibuprofen (~500-2000mg  of tylenol & 600-1200mg  of ibuprofen total per day). Is wondering if she has overdone it activity-wise since she is caring for her two young children.  No concerns about incisions. Tolerating regular diet. Has not yet returned to work.   I reviewed the pathology report with the patient. It was benign with tubal fimbriae, fragments of hemorrhagic corpus luteum, and negative for villi/trophoblasts/decidua. We also reviewed the images from her surgery as well as my intra-op findings of hemoperitoneum, adhesive disease between tube/fimbriae/colon and blood around tube.   Objective:   Vitals:   10/12/22 0951  BP: 119/78  Pulse: 78  Weight: 216 lb (98 kg)  Height: 5\' 8"  (1.727 m)   General:  Alert, oriented and cooperative. Patient is in no acute distress.  Skin: Skin is warm and dry. No rash noted.   Cardiovascular: Normal heart rate noted  Respiratory: Normal respiratory effort, no problems with respiration noted  Abdomen: Soft, non-distended. Mild tenderness below umbilical port site without rebound/guarding or defect noted. Laparoscopic incisions x 4 all well healed.   Pelvic: NEFG.  +levator & obturator tenderness. Reports that L sided back pain is elicited with palpation of left obturator.  MSK: +L lumbar paraspinal tenderness. No deformity. No R paraspinal or spinal tenderness.     Exam performed in the presence of a chaperone  Assessment and Plan:  Beth Hansen is a 37 y.o. with 4wk s/p diagnostic laparoscopy and L salpingectomy for ruptured tubal ectopic pregnancy  1. Postoperative examination Reviewed pathology and intraoperative findings as outlined above. Discussed significant adhesive disease that made her surgery more challenging and likely explains why we had pieces of ovary AND tube on her pathology. We reviewed significant risk of ectopic if she were to attempt to get pregnant again and even higher risks of pelvic surgery if she were to need it for another ectopic or gyn indication. Strongly recommended contraception. She would like to continue COCs.  Reviewed that she may return to work without restriction -     desogestrel-ethinyl estradiol (MIRCETTE) 0.15-0.02/0.01 MG (21/5) tablet; Take 1 tablet by mouth daily.  2. Pelvic floor dysfunction Discussed pelvic floor & lumbar muscle spasm as suspected cause of her continued pain. Recommended PT. Patient was worried that we would recommend another surgery, but I discussed that I would not recommend further operative procedures at this time. Her pain is very consistent with MSK etiology on exam today so I would defer imaging at this time. - Ambulatory referral to Physical Therapy -     cyclobenzaprine (FLEXERIL) 10 MG tablet; Take 1 tablet (10 mg total) by mouth every 8 (eight) hours as needed for muscle spasms.  Return in  about 3 months (around 01/11/2023) for follow up GYN .  No future appointments.  Lennart Pall, MD

## 2022-10-13 ENCOUNTER — Encounter: Payer: Self-pay | Admitting: Obstetrics and Gynecology

## 2022-10-13 NOTE — Therapy (Deleted)
OUTPATIENT PHYSICAL THERAPY FEMALE PELVIC EVALUATION   Patient Name: Beth Hansen MRN: [id number] DOB:1985/08/15, 37 y.o., female Today's Date: 10/13/2022  END OF SESSION:   Past Medical History:  Diagnosis Date   Anemia    Past Surgical History:  Procedure Laterality Date   IUD REMOVAL N/A 12/06/2013   Procedure: INTRAUTERINE DEVICE (IUD) REMOVAL;  Surgeon: Lazaro Arms, MD;  Location: [facility];  Service: Gynecology;  Laterality: N/A;   LAPAROSCOPIC TUBAL LIGATION Bilateral 12/06/2013   Procedure: LAPAROSCOPIC BILATERAL TUBAL CAUTERIZATION USING ELECTROCAUTERY;  Surgeon: Lazaro Arms, MD;  Location: [facility];  Service: Gynecology;  Laterality: Bilateral;   LAPAROSCOPIC UNILATERAL SALPINGECTOMY Left 09/11/2022   Procedure: LAPAROSCOPIC LEFT SALPINGECTOMY WITH REMOVAL OF ECTOPIC PREGNANCY;  Surgeon: Lennart Pall, MD;  Location: [facility];  Service: Gynecology;  Laterality: Left;   tubal reversal  03/03/2020   WISDOM TOOTH EXTRACTION     There are no problems to display for this patient.   PCP: GENERAL  REFERRING PROVIDER:   Lennart Pall, MD    REFERRING DIAG: [phone] (ICD-10-CM) - Pelvic floor dysfunction   THERAPY DIAG:  No diagnosis found.  Rationale for Evaluation and Treatment: Rehabilitation  ONSET DATE: ***  SUBJECTIVE:                                                                                                                                                                                           SUBJECTIVE STATEMENT: *** Fluid intake: {Yes/No:304960894}   PAIN:  Are you having pain? {yes/no:20286} NPRS scale: ***/10 Pain location: {pelvic pain location:27098}  Pain type: {type:313116} Pain description: {PAIN DESCRIPTION:21022940}   Aggravating factors: *** Relieving factors: ***  PRECAUTIONS: {Therapy precautions:24002}  WEIGHT BEARING RESTRICTIONS: {Yes ***/No:24003}  FALLS:  Has patient fallen in last 6 months?  {fallsyesno:27318}  LIVING ENVIRONMENT: Lives with: {[facility] lives with:25569::"lives with their family"} Lives in: {Lives in:25570} Stairs: {opstairs:27293} Has following equipment at home: {Assistive devices:23999}  OCCUPATION: ***  PLOF: {PLOF:24004}  PATIENT GOALS: ***  PERTINENT HISTORY:  Tubal ligation, tube reversal, ectopic pregnancy removal (left) Sexual abuse: {Yes/No:304960894}  BOWEL MOVEMENT: Pain with bowel movement: {yes/no:20286} Type of bowel movement:{PT BM type:27100} Fully empty rectum: {Yes/No:304960894} Leakage: {Yes/No:304960894} Pads: {Yes/No:304960894} Fiber supplement: {Yes/No:304960894}  URINATION: Pain with urination: {yes/no:20286} Fully empty bladder: {Yes/No:304960894} Stream: {PT urination:27102} Urgency: {Yes/No:304960894} Frequency: *** Leakage: {PT leakage:27103} Pads: {Yes/No:304960894}  INTERCOURSE: Pain with intercourse: {pain with intercourse PA:27099} Ability to have vaginal penetration:  {Yes/No:304960894} Climax: *** Marinoff Scale: ***/3  PREGNANCY: Vaginal deliveries *** Tearing {Yes***/No:304960894} C-section deliveries *** Currently pregnant {Yes***/No:304960894}  PROLAPSE: {PT prolapse:27101}   OBJECTIVE:   DIAGNOSTIC FINDINGS:  ***  PATIENT SURVEYS:  {rehab surveys:24030}  PFIQ-7 ***  COGNITION: Overall cognitive status: {cognition:24006}     SENSATION: Light touch: {intact/deficits:24005} Proprioception: {intact/deficits:24005}  MUSCLE LENGTH: Hamstrings: Right *** deg; Left *** deg Thomas test: Right *** deg; Left *** deg  LUMBAR SPECIAL TESTS:  {lumbar special test:25242}  FUNCTIONAL TESTS:  {Functional tests:24029}  GAIT: Distance walked: *** Assistive device utilized: {Assistive devices:23999} Level of assistance: {Levels of assistance:24026} Comments: ***  POSTURE: {posture:25561}  PELVIC ALIGNMENT:  LUMBARAROM/PROM:  A/PROM A/PROM  eval  Flexion   Extension   Right lateral  flexion   Left lateral flexion   Right rotation   Left rotation    (Blank rows = not tested)  LOWER EXTREMITY ROM:  {AROM/PROM:27142} ROM Right eval Left eval  Hip flexion    Hip extension    Hip abduction    Hip adduction    Hip internal rotation    Hip external rotation    Knee flexion    Knee extension    Ankle dorsiflexion    Ankle plantarflexion    Ankle inversion    Ankle eversion     (Blank rows = not tested)  LOWER EXTREMITY MMT:  MMT Right eval Left eval  Hip flexion    Hip extension    Hip abduction    Hip adduction    Hip internal rotation    Hip external rotation    Knee flexion    Knee extension    Ankle dorsiflexion    Ankle plantarflexion    Ankle inversion    Ankle eversion     PALPATION:   General  ***                External Perineal Exam ***                             Internal Pelvic Floor ***  Patient confirms identification and approves PT to assess internal pelvic floor and treatment {yes/no:20286}  PELVIC MMT:   MMT eval  Vaginal   Internal Anal Sphincter   External Anal Sphincter   Puborectalis   Diastasis Recti   (Blank rows = not tested)        TONE: ***  PROLAPSE: ***  TODAY'S TREATMENT:                                                                                                                              DATE: ***  EVAL ***   PATIENT EDUCATION:  Education details: *** Person educated: {Person educated:25204} Education method: {Education Method:25205} Education comprehension: {Education Comprehension:25206}  HOME EXERCISE PROGRAM: ***  ASSESSMENT:  CLINICAL IMPRESSION: Patient is a *** y.o. *** who was seen today for physical therapy evaluation and treatment for ***.   OBJECTIVE IMPAIRMENTS: {opptimpairments:25111}.   ACTIVITY LIMITATIONS: {activitylimitations:27494}  PARTICIPATION LIMITATIONS: {participationrestrictions:25113}  PERSONAL FACTORS: {Personal factors:25162} are also affecting  patient's functional outcome.   REHAB  POTENTIAL: {rehabpotential:25112}  CLINICAL DECISION MAKING: {clinical decision making:25114}  EVALUATION COMPLEXITY: {Evaluation complexity:25115}   GOALS: Goals reviewed with patient? {yes/no:20286}  SHORT TERM GOALS: Target date: ***  *** Baseline: Goal status: {GOALSTATUS:25110}  2.  *** Baseline:  Goal status: {GOALSTATUS:25110}  3.  *** Baseline:  Goal status: {GOALSTATUS:25110}  4.  *** Baseline:  Goal status: {GOALSTATUS:25110}  5.  *** Baseline:  Goal status: {GOALSTATUS:25110}  6.  *** Baseline:  Goal status: {GOALSTATUS:25110}  LONG TERM GOALS: Target date: ***  *** Baseline:  Goal status: {GOALSTATUS:25110}  2.  *** Baseline:  Goal status: {GOALSTATUS:25110}  3.  *** Baseline:  Goal status: {GOALSTATUS:25110}  4.  *** Baseline:  Goal status: {GOALSTATUS:25110}  5.  *** Baseline:  Goal status: {GOALSTATUS:25110}  6.  *** Baseline:  Goal status: {GOALSTATUS:25110}  PLAN:  PT FREQUENCY: {rehab frequency:25116}  PT DURATION: {rehab duration:25117}  PLANNED INTERVENTIONS: {rehab planned interventions:25118::"Therapeutic exercises","Therapeutic activity","Neuromuscular re-education","Balance training","Gait training","Patient/Family education","Self Care","Joint mobilization"}  PLAN FOR NEXT SESSION: ***   Brayton CavesJakki L Aarianna Hoadley, PT 10/13/2022, 9:50 AM

## 2022-10-14 ENCOUNTER — Encounter: Payer: Self-pay | Admitting: Physical Therapy

## 2022-10-14 ENCOUNTER — Other Ambulatory Visit: Payer: Self-pay

## 2022-10-14 ENCOUNTER — Ambulatory Visit: Payer: BC Managed Care – PPO | Attending: Obstetrics and Gynecology | Admitting: Physical Therapy

## 2022-10-14 DIAGNOSIS — R102 Pelvic and perineal pain: Secondary | ICD-10-CM | POA: Diagnosis present

## 2022-10-14 DIAGNOSIS — M62838 Other muscle spasm: Secondary | ICD-10-CM | POA: Diagnosis present

## 2022-10-14 DIAGNOSIS — M5459 Other low back pain: Secondary | ICD-10-CM | POA: Diagnosis present

## 2022-10-14 DIAGNOSIS — M6289 Other specified disorders of muscle: Secondary | ICD-10-CM | POA: Insufficient documentation

## 2022-10-14 NOTE — Therapy (Signed)
OUTPATIENT PHYSICAL THERAPY FEMALE PELVIC EVALUATION   Patient Name: Beth Hansen MRN: [id number] DOB:02/10/1986, 37 y.o., female Today's Date: 10/14/2022  END OF SESSION:  PT End of Session - 10/14/22 1236     Visit Number 1    Date for PT Re-Evaluation 01/06/23    Authorization Type [employer]    PT Start Time 1230    PT Stop Time 1310    PT Time Calculation (min) 40 min    Activity Tolerance Patient tolerated treatment well    Behavior During Therapy WFL for tasks assessed/performed             Past Medical History:  Diagnosis Date   Anemia    Past Surgical History:  Procedure Laterality Date   IUD REMOVAL N/A 12/06/2013   Procedure: INTRAUTERINE DEVICE (IUD) REMOVAL;  Surgeon: Lazaro Arms, MD;  Location: [facility];  Service: Gynecology;  Laterality: N/A;   LAPAROSCOPIC TUBAL LIGATION Bilateral 12/06/2013   Procedure: LAPAROSCOPIC BILATERAL TUBAL CAUTERIZATION USING ELECTROCAUTERY;  Surgeon: Lazaro Arms, MD;  Location: [facility];  Service: Gynecology;  Laterality: Bilateral;   LAPAROSCOPIC UNILATERAL SALPINGECTOMY Left 09/11/2022   Procedure: LAPAROSCOPIC LEFT SALPINGECTOMY WITH REMOVAL OF ECTOPIC PREGNANCY;  Surgeon: Lennart Pall, MD;  Location: [facility] OR;  Service: Gynecology;  Laterality: Left;   tubal reversal  03/03/2020   WISDOM TOOTH EXTRACTION     There are no problems to display for this patient.   PCP: [facility]  REFERRING PROVIDER: Lennart Pall, MD   REFERRING DIAG: [phone] (ICD-10-CM) - Pelvic floor dysfunction  THERAPY DIAG:  Other low back pain - Plan: PT plan of care cert/re-cert  Pelvic pain - Plan: PT plan of care cert/re-cert  Other muscle spasm - Plan: PT plan of care cert/re-cert  Rationale for Evaluation and Treatment: Rehabilitation  ONSET DATE: 09/11/22  SUBJECTIVE:                                                                                                                                                                                            SUBJECTIVE STATEMENT: Patient had surgery 09/11/22 for s/p diagnostic laparoscopy and left salpingectomy for ruptured ectopic pregnancy. Patient was taking birth control pills.  Fluid intake: Yes: water and juice   PAIN:  Are you having pain? Yes NPRS scale: 10/10 at times and low is 4/10 Pain location: left back and pubic area  Pain type: aching, sharp, and throbbing Pain description: constant   Aggravating factors: movement, walking Relieving factors: heating pad  PRECAUTIONS: None  WEIGHT BEARING RESTRICTIONS: No  FALLS:  Has patient fallen in  last 6 months? 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   BOWEL MOVEMENT: Pain with bowel movement: No   URINATION: Pain with urination: No Fully empty bladder: Yes:  Stream: Strong Urgency: No Frequency: average Leakage: none  INTERCOURSE:has not been active since surgery  PREGNANCY: Vaginal deliveries 2 Tearing No    OBJECTIVE:   DIAGNOSTIC FINDINGS:  none  PATIENT SURVEYS:  POPIQ-7 4  COGNITION: Overall cognitive status: Within functional limits for tasks assessed     SENSATION: Light touch: Appears intact Proprioception: Appears intact   LUMBAR SPECIAL TESTS:  SI Compression/distraction test: Positive, on the left    POSTURE: No Significant postural limitations  PELVIC ALIGNMENT: left ilium rotated posteriorly  LUMBARAROM/PROM:  A/PROM A/PROM  eval  Flexion Limited at end range  Extension Decreased by 75% deviating to the right  Right lateral flexion Decreased by 25%  Left lateral flexion full  Right rotation Decreased by 25%  Left rotation Decreased by 25%   (Blank rows = not  tested)  LOWER EXTREMITY ROM:  Passive ROM Right eval Left eval  Hip flexion  90  Hip internal rotation  5  Hip external rotation  40   (Blank rows = not tested)  LOWER EXTREMITY MMT: Bilateral  hip strength 4/5   PALPATION:   General  tenderness located in the left abdominal region. Tenderness on the pubic bone                External Perineal Exam tenderness located in the left levator ani, obturator internist                             Internal Pelvic Floor not assessed at this time  Patient confirms identification and approves PT to assess internal pelvic floor and treatment No, will do in the future. Too much pain today.   PELVIC MMT:   MMT eval  Vaginal   Internal Anal Sphincter   External Anal Sphincter   Puborectalis   Diastasis Recti   (Blank rows = not tested)          TODAY'S TREATMENT:                                                                                                                              DATE: 10/14/22  EVAL Manual: Soft tissue mobilization:   To assess for dry needling   Manual work to left quadratus, lumbar multifidi, gluteus Spinal mobilization:   Muscle energy to left ilium to correct rotation   Mobilization to sacrum to correct left rotation   Gapping of left side of L3-L5 Trigger Point Dry-Needling  Treatment instructions: Expect mild to moderate muscle soreness.  S/S of pneumothorax if dry needled over a lung field, and to seek immediate medical attention should they occur. Patient verbalized understanding of these instructions and education.  Patient Consent Given: Yes Education handout provided: Yes Muscles treated: left lumbar multifidi, left gluteus medius, left quadratus Electrical stimulation performed: No Parameters: N/A Treatment response/outcome: elongation of muscles and trigger point response    PATIENT EDUCATION:  10/14/22 Education details: Access Code: [email or web address] Person educated: Patient Education method:  Explanation, Demonstration, Tactile cues, Verbal cues, and Handouts Education comprehension: verbalized understanding, returned demonstration, verbal cues required, tactile cues required, and needs further education  HOME EXERCISE PROGRAM: 10/14/19 Access Code: [email or web address] URL: [email or web address] Date: 10/14/2022 Prepared by: Eulis Foster  Exercises - Cat Cow  - 1 x daily - 7 x weekly - 1 sets - 10 reps - Child's Pose Stretch  - 1 x daily - 7 x weekly - 3 sets - 10 reps - Prone Hip Extension with Bent Knee  - 1 x daily - 2 x weekly - 2 sets - 10 reps - Supine Hip Extension on Bench  - 1 x daily - 2 x weekly - 1 sets - 10 reps  Patient Education - Trigger Point Dry Needling Access Code: [email or web address] URL: [email or web address] Date: 10/14/2022 Prepared by: Eulis Foster  Program Notes sit with yoga block and gently move knees forward and backward 1x 2 times per day  Exercises - Child's Pose Stretch  - 1 x daily - 7 x weekly - 3 sets - 10 reps - Cat Cow  - 1 x daily - 7 x weekly - 3 sets - 10 reps  ASSESSMENT:  CLINICAL IMPRESSION: Patient is a 37 y.o. female who was seen today for physical therapy evaluation and treatment for pelvic floor dysfunction. Patient had  s/p diagnostic laparoscopy and left salpingectomy for ruptured ectopic pregnancy on 09/11/22. She reports her back pain and pubic bone pain is 10/ at worse and 5/10 constant. Bilateral hip strength is 4/5. Her left ilium is rotated posteriorly and sacrum rotated left. She has decreased lumbar ROM. Therapist did not do internal pelvic floor assessment due to her being in too much pain. She is not able to flex her left hip past 90 degrees and she is limited with left hip IR. Patient has muscle spasms in left lumbar, gluteal, and pelvic floor muscles. Patient is not able to return to her exercise or perform daily tasks due to her pain level. Patient will benefit from skilled therapy to improve her function  and  reduce her pain to return to exercise and daily tasks.   OBJECTIVE IMPAIRMENTS: decreased activity tolerance, decreased coordination, decreased endurance, decreased mobility, decreased ROM, decreased strength, increased fascial restrictions, increased muscle spasms, and pain.   ACTIVITY LIMITATIONS: carrying, lifting, bending, sitting, standing, squatting, sleeping, transfers, bed mobility, reach over head, and locomotion level  PARTICIPATION LIMITATIONS: meal prep, cleaning, laundry, interpersonal relationship, driving, shopping, community activity, and occupation  PERSONAL FACTORS: Past/current experiences, Time since onset of injury/illness/exacerbation, and 1 comorbidity: s/p diagnostic laparoscopy and left salpingectomy for ruptured ectopic pregnancy are also affecting patient's functional outcome.   REHAB POTENTIAL: Excellent  CLINICAL DECISION MAKING: Evolving/moderate complexity  EVALUATION COMPLEXITY: Moderate   GOALS: Goals reviewed with patient? Yes  SHORT TERM GOALS: Target date: 11/09/22  Assess internal pelvic floor muscles for trigger points and strength.  Baseline:not done yet Goal status: INITIAL  2.  Independent with initial HEP for stretches and diaphragmatic breathing Baseline: not educated yet Goal status: INITIAL  LONG TERM GOALS: Target date: 01/06/23  Independent with advanced HEP for core, hip and pelvic floor strength.  Baseline: Not educated yet Goal status: INITIAL  2.  Full lumbar ROM so patient is able to lift 10# with correct body mechanics and pain level </= 2/10.  Baseline: reduction in lumbar ROM and pain level 10/10 Goal status: INITIAL  3.  POPIQ-7 is </= 10 due to reduction in pain and reduction in frustration of pain limiting her in daily activities.  Baseline: POPIQ-7 76 Goal status: INITIAL  4.  Left hip flexion >/= 110 degrees so she is able to squat with pain level </= 2/10.  Baseline: Left hip flexion 90 degrees Goal status:  INITIAL  5.  Patient able to walk for 45 minutes without pain level >/= 2/10 due to left ilium in correct alignment.  Baseline: left ilium is posteriorly rotated, pain level 10/10 Goal status: INITIAL   PLAN:  PT FREQUENCY: 1-2x/week  PT DURATION: 12 weeks  PLANNED INTERVENTIONS: Therapeutic exercises, Therapeutic activity, Neuromuscular re-education, Patient/Family education, Joint mobilization, Dry Needling, Electrical stimulation, Spinal mobilization, Cryotherapy, Moist heat, Taping, Ultrasound, Biofeedback, and Manual therapy  PLAN FOR NEXT SESSION: correct pelvis, dry needling the back if needed, assess pelvic floor, diaphragmatic breathing to perform pelvic drop, left hip mobilization, manual work to left lumbar and gluteal, manual work suprapubically   Eulis Fosterheryl Torez Beauregard, PT 10/14/22 4:26 PM

## 2022-10-15 ENCOUNTER — Ambulatory Visit (INDEPENDENT_AMBULATORY_CARE_PROVIDER_SITE_OTHER): Payer: BC Managed Care – PPO | Admitting: Student

## 2022-10-15 VITALS — BP 107/81 | HR 86 | Ht 68.0 in | Wt 215.4 lb

## 2022-10-15 DIAGNOSIS — Z30017 Encounter for initial prescription of implantable subdermal contraceptive: Secondary | ICD-10-CM

## 2022-10-15 DIAGNOSIS — Z3202 Encounter for pregnancy test, result negative: Secondary | ICD-10-CM | POA: Diagnosis not present

## 2022-10-15 LAB — POCT URINE PREGNANCY: Preg Test, Ur: NEGATIVE

## 2022-10-15 MED ORDER — ETONOGESTREL 68 MG ~~LOC~~ IMPL
68.0000 mg | DRUG_IMPLANT | Freq: Once | SUBCUTANEOUS | Status: AC
  Administered 2022-10-15: 68 mg via SUBCUTANEOUS

## 2022-10-15 NOTE — Progress Notes (Signed)
  History:  Ms. Beth Hansen is a 37 y.o. [id number] who presents to clinic today for nexplanon insertion.   The following portions of the patient's history were reviewed and updated as appropriate: allergies, current medications, family history, past medical history, social history, past surgical history and problem list.  Review of Systems:  Review of Systems  All other systems reviewed and are negative.    Objective:  Physical Exam LMP 08/09/2022 (Exact Date)  Physical Exam Vitals reviewed. Exam conducted with a chaperone present.  Constitutional:      Appearance: Normal appearance.  Cardiovascular:     Rate and Rhythm: Normal rate.  Pulmonary:     Effort: Pulmonary effort is normal.  Musculoskeletal:        General: Normal range of motion.  Skin:    General: Skin is warm and dry.  Neurological:     Mental Status: She is alert and oriented to person, place, and time. Mental status is at baseline.  Psychiatric:        Mood and Affect: Mood normal.        Behavior: Behavior normal.        Judgment: Judgment normal.    GYNECOLOGY CLINIC PROCEDURE NOTE  Nexplanon Insertion Procedure Patient was given informed consent, she signed consent form.  Patient does understand that irregular bleeding is a very common side effect of this medication. She was advised to have backup contraception for one week after placement. Pregnancy test in clinic today was negative.  Appropriate time out taken.  Patient's left arm was prepped and draped in the usual sterile fashion.. The ruler used to measure and mark insertion area.  Patient was prepped with alcohol swab and then injected with 3 ml of 1% lidocaine.  She was prepped with betadine, Nexplanon removed from packaging,  Device confirmed in needle, then inserted full length of needle and withdrawn per handbook instructions. Nexplanon was able to palpated in the patient's arm; patient palpated the insert herself. There was minimal blood loss.   Patient insertion site covered with guaze and a pressure bandage to reduce any bruising.  The patient tolerated the procedure well and was given post procedure instructions.    Labs and Imaging No results found for this or any previous visit (from the past 24 hour(s)).  No results found.  Health Maintenance Due  Topic Date Due   COVID-19 Vaccine (1) Never done   Hepatitis C Screening  Never done   DTaP/Tdap/Td (2 - Td or Tdap) 12/26/2021    Labs, imaging and previous visits in Epic and Care Everywhere reviewed  Assessment & Plan:  1. Encounter for initial prescription of Nexplanon - successful placement    Beth Hove, NP 10/15/2022 7:57 AM

## 2022-10-20 ENCOUNTER — Encounter: Payer: BC Managed Care – PPO | Attending: Obstetrics and Gynecology | Admitting: Physical Therapy

## 2022-10-20 ENCOUNTER — Encounter: Payer: Self-pay | Admitting: Physical Therapy

## 2022-10-20 DIAGNOSIS — M5459 Other low back pain: Secondary | ICD-10-CM | POA: Insufficient documentation

## 2022-10-20 DIAGNOSIS — R102 Pelvic and perineal pain: Secondary | ICD-10-CM | POA: Insufficient documentation

## 2022-10-20 DIAGNOSIS — M62838 Other muscle spasm: Secondary | ICD-10-CM | POA: Insufficient documentation

## 2022-10-20 NOTE — Therapy (Signed)
OUTPATIENT PHYSICAL THERAPY TREATMENT NOTE   Patient Name: Beth Hansen MRN: [id number] DOB:[birth year], 37 y.o., female Today's Date: 10/20/2022  PCP: [facility]  REFERRING PROVIDER: Lennart Pall, MD   END OF SESSION:   PT End of Session - 10/20/22 0839     Visit Number 2    Date for PT Re-Evaluation 01/06/23    Authorization Type [employer]    Authorization Time Period 10/14/2022-01/12/2023    Authorization - Visit Number 1    Authorization - Number of Visits 13    PT Start Time 0830    PT Stop Time 0925    PT Time Calculation (min) 55 min    Activity Tolerance Patient tolerated treatment well    Behavior During Therapy [facility] for tasks assessed/performed             Past Medical History:  Diagnosis Date   Anemia    Past Surgical History:  Procedure Laterality Date   IUD REMOVAL N/A 12/06/2013   Procedure: INTRAUTERINE DEVICE (IUD) REMOVAL;  Surgeon: Lazaro Arms, MD;  Location: [facility];  Service: Gynecology;  Laterality: N/A;   LAPAROSCOPIC TUBAL LIGATION Bilateral 12/06/2013   Procedure: LAPAROSCOPIC BILATERAL TUBAL CAUTERIZATION USING ELECTROCAUTERY;  Surgeon: Lazaro Arms, MD;  Location: [facility];  Service: Gynecology;  Laterality: Bilateral;   LAPAROSCOPIC UNILATERAL SALPINGECTOMY Left 09/11/2022   Procedure: LAPAROSCOPIC LEFT SALPINGECTOMY WITH REMOVAL OF ECTOPIC PREGNANCY;  Surgeon: Lennart Pall, MD;  Location: [facility] OR;  Service: Gynecology;  Laterality: Left;   tubal reversal  03/03/2020   WISDOM TOOTH EXTRACTION     There are no problems to display for this patient.  REFERRING DIAG: M62.89 (ICD-10-CM) - Pelvic floor dysfunction   THERAPY DIAG:  Other low back pain - Plan: PT plan of care cert/re-cert   Pelvic pain - Plan: PT plan of care cert/re-cert   Other muscle spasm - Plan: PT plan of care cert/re-cert   Rationale for Evaluation and Treatment: Rehabilitation   ONSET DATE: 09/11/22   SUBJECTIVE:                                                                                                                                                                                             SUBJECTIVE STATEMENT: The therapy last time helped.    PAIN:  Are you having pain? Yes NPRS scale: 7/10 at times and low is 4/10 Pain location: left back and pubic area   Pain type: aching, sharp, and throbbing Pain description: constant    Aggravating factors: movement, walking Relieving factors: heating pad   PRECAUTIONS: None  WEIGHT BEARING RESTRICTIONS: No   FALLS:  Has patient fallen in last 6 months? 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    BOWEL MOVEMENT: Pain with bowel movement: No     URINATION: Pain with urination: No Fully empty bladder: Yes:  Stream: Strong Urgency: No Frequency: average Leakage: none   INTERCOURSE:has not been active since surgery   PREGNANCY: Vaginal deliveries 2 Tearing No       OBJECTIVE:    DIAGNOSTIC FINDINGS:  none   PATIENT SURVEYS:  POPIQ-7 51   COGNITION: Overall cognitive status: Within functional limits for tasks assessed                          SENSATION: Light touch: Appears intact Proprioception: Appears intact     LUMBAR SPECIAL TESTS:  SI Compression/distraction test: Positive, on the left      POSTURE: No Significant postural limitations   PELVIC ALIGNMENT: left ilium rotated posteriorly   LUMBARAROM/PROM:   A/PROM A/PROM  eval  Flexion Limited at end range  Extension Decreased by 75% deviating to the right  Right lateral flexion Decreased by 25%  Left lateral flexion full  Right rotation Decreased by 25%  Left rotation Decreased by 25%   (Blank  rows = not tested)   LOWER EXTREMITY ROM:   Passive ROM Right eval Left eval Left  10/20/22  Hip flexion   90 110  Hip internal rotation   5   Hip external rotation   40    (Blank rows = not tested)   LOWER EXTREMITY MMT: Bilateral  hip strength 4/5     PALPATION:   General  tenderness located in the left abdominal region. Tenderness on the pubic bone                 External Perineal Exam tenderness located in the left levator ani, obturator internist                             Internal Pelvic Floor not assessed at this time   Patient confirms identification and approves PT to assess internal pelvic floor and treatment No, will do in the future. Too much pain today.    PELVIC MMT:   MMT eval  Vaginal    Internal Anal Sphincter    External Anal Sphincter    Puborectalis    Diastasis Recti    (Blank rows = not tested)             TODAY'S TREATMENT:    10/20/22 Manual: Soft tissue mobilization: To assess for dry needling Manual work to left hip adductor, left inguinal area, left gluteal, lumbar paraspinals Myofascial release: Using the suction cup to the lumbar and left guteals, using the isastim to the same Spinal mobilization: PA and rotational mobilization to L1-L5 grade 2-3 Trigger Point Dry-Needling  Treatment instructions: Expect mild to moderate muscle soreness. S/S of pneumothorax if dry needled over a lung field, and to seek immediate medical attention should they occur. Patient verbalized understanding of these instructions and education.  Patient Consent Given: Yes Education handout provided: Previously provided Muscles treated: lumbar multifidi; left glutaeus  medius, glutaeus maximus, left hip adductor, left quadratus Electrical stimulation performed: No Parameters: N/A Treatment response/outcome: elongation of muscles and trigger point response  Exercises: Stretches/mobility: Left hamstring stretch in sitting holding for 30 sec Left hip adductor  stretch in sitting holding for 30 sec Left piriformis stretch in sitting holding for 30 seconds Educated patient on how to use a ball to massage the left gluteal, pelvic floor and hamstring in stting                                                                                                                              DATE: 10/14/22  EVAL Manual: Soft tissue mobilization:                         To assess for dry needling                         Manual work to left quadratus, lumbar multifidi, gluteus Spinal mobilization:                         Muscle energy to left ilium to correct rotation                         Mobilization to sacrum to correct left rotation                         Gapping of left side of L3-L5 Trigger Point Dry-Needling  Treatment instructions: Expect mild to moderate muscle soreness. S/S of pneumothorax if dry needled over a lung field, and to seek immediate medical attention should they occur. Patient verbalized understanding of these instructions and education.   Patient Consent Given: Yes Education handout provided: Yes Muscles treated: left lumbar multifidi, left gluteus medius, left quadratus Electrical stimulation performed: No Parameters: N/A Treatment response/outcome: elongation of muscles and trigger point response       PATIENT EDUCATION:  10/20/22 Education details: Access Code: [email or web address], how to use a ball to massage the pelvic floor, gluteal and hamstring muscles at home Person educated: Patient Education method: Explanation, Demonstration, Tactile cues, Verbal cues, and Handouts Education comprehension: verbalized understanding, returned demonstration, verbal cues required, tactile cues required, and needs further education   HOME EXERCISE PROGRAM: 10/20/19 Access Code: [email or web address] URL: [email or web address] Date: 10/20/2022 Prepared by: Eulis Foster  Program Notes sit with yoga block and gently move knees forward and backward  1x 2 times per day  Exercises - Child's Pose Stretch  - 1 x daily - 7 x weekly - 3 sets - 10 reps - Cat Cow  - 1 x daily - 7 x weekly - 3 sets - 10 reps - Seated Hamstring Stretch  - 1 x daily - 7 x weekly - 3 sets - 2 reps - 30 sec hold - Seated Hip Adductor  Stretch  - 1 x daily - 7 x weekly - 1 sets - 2 reps - 30 se3c hold - Seated Piriformis Stretch with Trunk Bend  - 1 x daily - 7 x weekly - 1 sets - 2 reps - 30 sec hold    ASSESSMENT:   CLINICAL IMPRESSION: Patient is a 37 y.o. female who was seen today for physical therapy evaluation and treatment for pelvic floor dysfunction. Patient responds well to the dry needling. She has  increased in left hip flexion to 110 degrees. Her lumbar and gluteal tissue hand improved mobility. She has learned some stretches to keep the mobility.  She had trigger points in her left hip adductors. She Patient will benefit from skilled therapy to improve her function  and reduce her pain to return to exercise and daily tasks.    OBJECTIVE IMPAIRMENTS: decreased activity tolerance, decreased coordination, decreased endurance, decreased mobility, decreased ROM, decreased strength, increased fascial restrictions, increased muscle spasms, and pain.    ACTIVITY LIMITATIONS: carrying, lifting, bending, sitting, standing, squatting, sleeping, transfers, bed mobility, reach over head, and locomotion level   PARTICIPATION LIMITATIONS: meal prep, cleaning, laundry, interpersonal relationship, driving, shopping, community activity, and occupation   PERSONAL FACTORS: Past/current experiences, Time since onset of injury/illness/exacerbation, and 1 comorbidity: s/p diagnostic laparoscopy and left salpingectomy for ruptured ectopic pregnancy are also affecting patient's functional outcome.    REHAB POTENTIAL: Excellent   CLINICAL DECISION MAKING: Evolving/moderate complexity   EVALUATION COMPLEXITY: Moderate     GOALS: Goals reviewed with patient? Yes   SHORT  TERM GOALS: Target date: 11/09/22   Assess internal pelvic floor muscles for trigger points and strength.  Baseline:not done yet Goal status: INITIAL   2.  Independent with initial HEP for stretches and diaphragmatic breathing Baseline: not educated yet Goal status: INITIAL     LONG TERM GOALS: Target date: 01/06/23   Independent with advanced HEP for core, hip and pelvic floor strength.  Baseline: Not educated yet Goal status: INITIAL   2.  Full lumbar ROM so patient is able to lift 10# with correct body mechanics and pain level </= 2/10.  Baseline: reduction in lumbar ROM and pain level 10/10 Goal status: INITIAL   3.  POPIQ-7 is </= 10 due to reduction in pain and reduction in frustration of pain limiting her in daily activities.  Baseline: POPIQ-7 76 Goal status: INITIAL   4.  Left hip flexion >/= 110 degrees so she is able to squat with pain level </= 2/10.  Baseline: Left hip flexion 90 degrees Goal status: INITIAL   5.  Patient able to walk for 45 minutes without pain level >/= 2/10 due to left ilium in correct alignment.  Baseline: left ilium is posteriorly rotated, pain level 10/10 Goal status: INITIAL     PLAN:   PT FREQUENCY: 1-2x/week   PT DURATION: 12 weeks   PLANNED INTERVENTIONS: Therapeutic exercises, Therapeutic activity, Neuromuscular re-education, Patient/Family education, Joint mobilization, Dry Needling, Electrical stimulation, Spinal mobilization, Cryotherapy, Moist heat, Taping, Ultrasound, Biofeedback, and Manual therapy   PLAN FOR NEXT SESSION: correct pelvis, dry needling the back  and left hip adductor and left hamstring, assess pelvic floor, diaphragmatic breathing to perform pelvic drop, left hip mobilization, manual work to left lumbar and gluteal, manual work suprapubically    Eulis Foster, PT 10/20/22 1:03 PM

## 2022-10-22 ENCOUNTER — Encounter: Payer: BC Managed Care – PPO | Admitting: Physical Therapy

## 2022-10-22 ENCOUNTER — Encounter: Payer: Self-pay | Admitting: Physical Therapy

## 2022-10-22 DIAGNOSIS — R102 Pelvic and perineal pain: Secondary | ICD-10-CM

## 2022-10-22 DIAGNOSIS — M62838 Other muscle spasm: Secondary | ICD-10-CM

## 2022-10-22 DIAGNOSIS — M5459 Other low back pain: Secondary | ICD-10-CM

## 2022-10-22 NOTE — Therapy (Signed)
OUTPATIENT PHYSICAL THERAPY TREATMENT NOTE   Patient Name: Beth Hansen MRN: [id number] DOB:20-Jun-1986, 37 y.o., female Today's Date: 10/22/2022  PCP: [facility]  REFERRING PROVIDER: Lennart Pall, MD   END OF SESSION:   PT End of Session - 10/22/22 1407     Visit Number 3    Date for PT Re-Evaluation 01/06/23    Authorization Type [employer]    Authorization Time Period 10/14/2022-01/12/2023    Authorization - Visit Number 2    Authorization - Number of Visits 13    PT Start Time 1400    PT Stop Time 1445    PT Time Calculation (min) 45 min    Activity Tolerance Patient tolerated treatment well    Behavior During Therapy WFL for tasks assessed/performed             Past Medical History:  Diagnosis Date   Anemia    Past Surgical History:  Procedure Laterality Date   IUD REMOVAL N/A 12/06/2013   Procedure: INTRAUTERINE DEVICE (IUD) REMOVAL;  Surgeon: Lazaro Arms, MD;  Location: [facility];  Service: Gynecology;  Laterality: N/A;   LAPAROSCOPIC TUBAL LIGATION Bilateral 12/06/2013   Procedure: LAPAROSCOPIC BILATERAL TUBAL CAUTERIZATION USING ELECTROCAUTERY;  Surgeon: Lazaro Arms, MD;  Location: [facility];  Service: Gynecology;  Laterality: Bilateral;   LAPAROSCOPIC UNILATERAL SALPINGECTOMY Left 09/11/2022   Procedure: LAPAROSCOPIC LEFT SALPINGECTOMY WITH REMOVAL OF ECTOPIC PREGNANCY;  Surgeon: Lennart Pall, MD;  Location: [facility] OR;  Service: Gynecology;  Laterality: Left;   tubal reversal  03/03/2020   WISDOM TOOTH EXTRACTION     There are no problems to display for this patient.  REFERRING DIAG: M62.89 (ICD-10-CM) - Pelvic floor dysfunction   THERAPY DIAG:  Other low back pain - Plan: PT plan of care cert/re-cert   Pelvic pain - Plan: PT plan of care cert/re-cert   Other muscle spasm - Plan: PT plan of care cert/re-cert   Rationale for Evaluation and Treatment: Rehabilitation   ONSET DATE: 09/11/22   SUBJECTIVE:                                                                                                                                                                                             SUBJECTIVE STATEMENT: The inner thigh is still in a knot. I got a ball to massage my back and pelvic floor.    PAIN:  Are you having pain? Yes NPRS scale: 7/10 at times and low is 4/10 Pain location: left back and pubic area   Pain type: aching, sharp, and throbbing Pain description: constant  Aggravating factors: movement, walking Relieving factors: heating pad   PRECAUTIONS: None   WEIGHT BEARING RESTRICTIONS: No   FALLS:  Has patient fallen in last 6 months? 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    BOWEL MOVEMENT: Pain with bowel movement: No     URINATION: Pain with urination: No Fully empty bladder: Yes:  Stream: Strong Urgency: No Frequency: average Leakage: none   INTERCOURSE:has not been active since surgery   PREGNANCY: Vaginal deliveries 2 Tearing No       OBJECTIVE:    DIAGNOSTIC FINDINGS:  none   PATIENT SURVEYS:  POPIQ-7 44   COGNITION: Overall cognitive status: Within functional limits for tasks assessed                          SENSATION: Light touch: Appears intact Proprioception: Appears intact     LUMBAR SPECIAL TESTS:  SI Compression/distraction test: Positive, on the left      POSTURE: No Significant postural limitations   PELVIC ALIGNMENT: left ilium rotated posteriorly   LUMBARAROM/PROM:   A/PROM A/PROM  eval 10/22/22  Flexion Limited at end range   Extension Decreased by 75% deviating to the right Decreased by 50% without deviation  Right lateral flexion Decreased by 25%   Left  lateral flexion full   Right rotation Decreased by 25%   Left rotation Decreased by 25%    (Blank rows = not tested)   LOWER EXTREMITY ROM:   Passive ROM Right eval Left eval Left  10/20/22  Hip flexion   90 110  Hip internal rotation   5    Hip external rotation   40     (Blank rows = not tested)   LOWER EXTREMITY MMT: Bilateral  hip strength 4/5     PALPATION:   General  tenderness located in the left abdominal region. Tenderness on the pubic bone                 External Perineal Exam tenderness located in the left levator ani, obturator internist                             Internal Pelvic Floor not assessed at this time   Patient confirms identification and approves PT to assess internal pelvic floor and treatment No, will do in the future. Too much pain today.    PELVIC MMT:   MMT eval  Vaginal    Internal Anal Sphincter    External Anal Sphincter    Puborectalis    Diastasis Recti    (Blank rows = not tested)             TODAY'S TREATMENT:    10/22/22 Manual: Soft tissue mobilization: Manual work to the left quadratus Manual work to the left hip adductor, left quadriceps, left lateral hip, levator ani, and just above the left knee Spinal mobilization: PA and rotational mobilization to T3-L5 Flexing lumbar spine and gapping the facets Internal pelvic floor techniques: No emotional/communication barriers or cognitive limitation. Patient is motivated to learn. Patient understands and agrees with treatment goals and plan. PT explains patient will be examined in standing, sitting,  and lying down to see how their muscles and joints work. When they are ready, they will be asked to remove their underwear so PT can examine their perineum. The patient is also given the option of providing their own chaperone as one is not provided in our facility. The patient also has the right and is explained the right to defer or refuse any part of the evaluation or treatment  including the internal exam. With the patient's consent, PT will use one gloved finger to gently assess the muscles of the pelvic floor, seeing how well it contracts and relaxes and if there is muscle symmetry. After, the patient will get dressed and PT and patient will discuss exam findings and plan of care. PT and patient discuss plan of care, schedule, attendance policy and HEP activities.  Exercises: Stretches/mobility: Lay on foam roll along spine to decompress spine Pelvic tilt on foam roll Supine on foam roll flexion of left shoulder and abduction to open up thoracic lumbar area Open book 10 x on each side Standing hip adductor stretch  T   10/20/22 Manual: Soft tissue mobilization: To assess for dry needling Manual work to left hip adductor, left inguinal area, left gluteal, lumbar paraspinals Myofascial release: Using the suction cup to the lumbar and left guteals, using the isastim to the same Spinal mobilization: PA and rotational mobilization to L1-L5 grade 2-3 Trigger Point Dry-Needling  Treatment instructions: Expect mild to moderate muscle soreness. S/S of pneumothorax if dry needled over a lung field, and to seek immediate medical attention should they occur. Patient verbalized understanding of these instructions and education.   Patient Consent Given: Yes Education handout provided: Previously provided Muscles treated: lumbar multifidi; left glutaeus medius, glutaeus maximus, left hip adductor, left quadratus Electrical stimulation performed: No Parameters: N/A Treatment response/outcome: elongation of muscles and trigger point response   Exercises: Stretches/mobility: Left hamstring stretch in sitting holding for 30 sec Left hip adductor stretch in sitting holding for 30 sec Left piriformis stretch in sitting holding for 30 seconds Educated patient on how to use a ball to massage the left gluteal, pelvic floor and hamstring in stting                                                                                                                               DATE: 10/14/22  EVAL Manual: Soft tissue mobilization:                         To assess for dry needling                         Manual work to left quadratus, lumbar multifidi, gluteus Spinal mobilization:                         Muscle energy to left ilium to correct rotation  Mobilization to sacrum to correct left rotation                         Gapping of left side of L3-L5 Trigger Point Dry-Needling  Treatment instructions: Expect mild to moderate muscle soreness. S/S of pneumothorax if dry needled over a lung field, and to seek immediate medical attention should they occur. Patient verbalized understanding of these instructions and education.   Patient Consent Given: Yes Education handout provided: Yes Muscles treated: left lumbar multifidi, left gluteus medius, left quadratus Electrical stimulation performed: No Parameters: N/A Treatment response/outcome: elongation of muscles and trigger point response       PATIENT EDUCATION:  10/20/22 Education details: Access Code: [email or web address], how to use a ball to massage the pelvic floor, gluteal and hamstring muscles at home Person educated: Patient Education method: Explanation, Demonstration, Tactile cues, Verbal cues, and Handouts Education comprehension: verbalized understanding, returned demonstration, verbal cues required, tactile cues required, and needs further education   HOME EXERCISE PROGRAM: 10/20/19 Access Code: [email or web address] URL: [email or web address] Date: 10/20/2022 Prepared by: Eulis Foster   Program Notes sit with yoga block and gently move knees forward and backward 1x 2 times per day   Exercises - Child's Pose Stretch  - 1 x daily - 7 x weekly - 3 sets - 10 reps - Cat Cow  - 1 x daily - 7 x weekly - 3 sets - 10 reps - Seated Hamstring Stretch  - 1 x daily - 7 x weekly - 3 sets - 2 reps - 30  sec hold - Seated Hip Adductor Stretch  - 1 x daily - 7 x weekly - 1 sets - 2 reps - 30 se3c hold - Seated Piriformis Stretch with Trunk Bend  - 1 x daily - 7 x weekly - 1 sets - 2 reps - 30 sec hold     ASSESSMENT:   CLINICAL IMPRESSION: Patient is a 37 y.o. female who was seen today for physical therapy evaluation and treatment for pelvic floor dysfunction. Patient has tightness in the low thoracic lumbar area.  She has improve mobility of the left thigh and levator muscles. She is able to place more weight on the left leg. She has increased in lumbar extension and no deviation. Patient will benefit from skilled therapy to improve her function  and reduce her pain to return to exercise and daily tasks.    OBJECTIVE IMPAIRMENTS: decreased activity tolerance, decreased coordination, decreased endurance, decreased mobility, decreased ROM, decreased strength, increased fascial restrictions, increased muscle spasms, and pain.    ACTIVITY LIMITATIONS: carrying, lifting, bending, sitting, standing, squatting, sleeping, transfers, bed mobility, reach over head, and locomotion level   PARTICIPATION LIMITATIONS: meal prep, cleaning, laundry, interpersonal relationship, driving, shopping, community activity, and occupation   PERSONAL FACTORS: Past/current experiences, Time since onset of injury/illness/exacerbation, and 1 comorbidity: s/p diagnostic laparoscopy and left salpingectomy for ruptured ectopic pregnancy are also affecting patient's functional outcome.    REHAB POTENTIAL: Excellent   CLINICAL DECISION MAKING: Evolving/moderate complexity   EVALUATION COMPLEXITY: Moderate     GOALS: Goals reviewed with patient? Yes   SHORT TERM GOALS: Target date: 11/09/22   Assess internal pelvic floor muscles for trigger points and strength.  Baseline:not done yet Goal status: INITIAL   2.  Independent with initial HEP for stretches and diaphragmatic breathing Baseline: not educated yet Goal  status: ongoing 10/22/22     LONG TERM GOALS: Target date: 01/06/23   Independent with advanced HEP  for core, hip and pelvic floor strength.  Baseline: Not educated yet Goal status: INITIAL   2.  Full lumbar ROM so patient is able to lift 10# with correct body mechanics and pain level </= 2/10.  Baseline: reduction in lumbar ROM and pain level 10/10 Goal status: INITIAL   3.  POPIQ-7 is </= 10 due to reduction in pain and reduction in frustration of pain limiting her in daily activities.  Baseline: POPIQ-7 76 Goal status: INITIAL   4.  Left hip flexion >/= 110 degrees so she is able to squat with pain level </= 2/10.  Baseline: Left hip flexion 90 degrees Goal status: INITIAL   5.  Patient able to walk for 45 minutes without pain level >/= 2/10 due to left ilium in correct alignment.  Baseline: left ilium is posteriorly rotated, pain level 10/10 Goal status: INITIAL     PLAN:   PT FREQUENCY: 1-2x/week   PT DURATION: 12 weeks   PLANNED INTERVENTIONS: Therapeutic exercises, Therapeutic activity, Neuromuscular re-education, Patient/Family education, Joint mobilization, Dry Needling, Electrical stimulation, Spinal mobilization, Cryotherapy, Moist heat, Taping, Ultrasound, Biofeedback, and Manual therapy   PLAN FOR NEXT SESSION: correct pelvis, dry needling the back  and left hip adductor , assess pelvic floor, diaphragmatic breathing to perform pelvic drop, left hip mobilization, manual work to left lumbar and gluteal, manual work suprapubically  Eulis Foster, PT 10/22/22 3:00 PM

## 2022-10-27 ENCOUNTER — Other Ambulatory Visit: Payer: Self-pay | Admitting: Nurse Practitioner

## 2022-10-27 ENCOUNTER — Encounter: Payer: BC Managed Care – PPO | Admitting: Physical Therapy

## 2022-10-27 ENCOUNTER — Encounter: Payer: Self-pay | Admitting: Nurse Practitioner

## 2022-10-27 DIAGNOSIS — R946 Abnormal results of thyroid function studies: Secondary | ICD-10-CM

## 2022-10-29 ENCOUNTER — Encounter: Payer: BC Managed Care – PPO | Admitting: Physical Therapy

## 2022-10-29 ENCOUNTER — Encounter: Payer: Self-pay | Admitting: Physical Therapy

## 2022-10-29 DIAGNOSIS — M5459 Other low back pain: Secondary | ICD-10-CM | POA: Diagnosis not present

## 2022-10-29 DIAGNOSIS — M62838 Other muscle spasm: Secondary | ICD-10-CM

## 2022-10-29 DIAGNOSIS — R102 Pelvic and perineal pain: Secondary | ICD-10-CM

## 2022-10-29 NOTE — Therapy (Signed)
OUTPATIENT PHYSICAL THERAPY TREATMENT NOTE   Patient Name: Beth Hansen MRN: [id number] DOB:[birth year], 37 y.o., female Today's Date: 10/29/2022  PCP: [facility]  REFERRING PROVIDER: Lennart Pall, MD   END OF SESSION:   PT End of Session - 10/29/22 1134     Visit Number 4    Date for PT Re-Evaluation 01/06/23    Authorization Type [employer]    Authorization Time Period 10/14/2022-01/12/2023    Authorization - Visit Number 3    Authorization - Number of Visits 13    PT Start Time 1130    PT Stop Time 1215    PT Time Calculation (min) 45 min    Activity Tolerance Patient tolerated treatment well    Behavior During Therapy WFL for tasks assessed/performed             Past Medical History:  Diagnosis Date   Anemia    Past Surgical History:  Procedure Laterality Date   IUD REMOVAL N/A 12/06/2013   Procedure: INTRAUTERINE DEVICE (IUD) REMOVAL;  Surgeon: Lazaro Arms, MD;  Location: [facility];  Service: Gynecology;  Laterality: N/A;   LAPAROSCOPIC TUBAL LIGATION Bilateral 12/06/2013   Procedure: LAPAROSCOPIC BILATERAL TUBAL CAUTERIZATION USING ELECTROCAUTERY;  Surgeon: Lazaro Arms, MD;  Location: [facility];  Service: Gynecology;  Laterality: Bilateral;   LAPAROSCOPIC UNILATERAL SALPINGECTOMY Left 09/11/2022   Procedure: LAPAROSCOPIC LEFT SALPINGECTOMY WITH REMOVAL OF ECTOPIC PREGNANCY;  Surgeon: Lennart Pall, MD;  Location: [facility] OR;  Service: Gynecology;  Laterality: Left;   tubal reversal  03/03/2020   WISDOM TOOTH EXTRACTION     There are no problems to display for this patient. REFERRING DIAG: M62.89 (ICD-10-CM) - Pelvic floor dysfunction   THERAPY DIAG:  Other low back pain - Plan: PT plan of care cert/re-cert   Pelvic pain - Plan: PT plan of care cert/re-cert   Other muscle spasm - Plan: PT plan of care cert/re-cert   Rationale for Evaluation and Treatment: Rehabilitation   ONSET DATE: 09/11/22   SUBJECTIVE:                                                                                                                                                                                             SUBJECTIVE STATEMENT: The inner thigh is still in a knot. I got a ball to massage my back and pelvic floor.    PAIN:  Are you having pain? Yes NPRS scale: 7/10 at times and low is 4/10 Pain location: left back and pubic area   Pain type: aching, sharp, and throbbing Pain description: constant  Aggravating factors: movement, walking Relieving factors: heating pad   PRECAUTIONS: None   WEIGHT BEARING RESTRICTIONS: No   FALLS:  Has patient fallen in last 6 months? 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    BOWEL MOVEMENT: Pain with bowel movement: No     URINATION: Pain with urination: No Fully empty bladder: Yes:  Stream: Strong Urgency: No Frequency: average Leakage: none   INTERCOURSE:has not been active since surgery   PREGNANCY: Vaginal deliveries 2 Tearing No       OBJECTIVE:    DIAGNOSTIC FINDINGS:  none   PATIENT SURVEYS:  POPIQ-7 23   COGNITION: Overall cognitive status: Within functional limits for tasks assessed                          SENSATION: Light touch: Appears intact Proprioception: Appears intact     LUMBAR SPECIAL TESTS:  SI Compression/distraction test: Positive, on the left      POSTURE: No Significant postural limitations   PELVIC ALIGNMENT: left ilium rotated posteriorly   LUMBARAROM/PROM:   A/PROM A/PROM  eval 10/22/22  Flexion Limited at end range    Extension Decreased by 75% deviating to the right Decreased by 50% without deviation  Right lateral flexion Decreased by 25%    Left  lateral flexion full    Right rotation Decreased by 25%    Left rotation Decreased by 25%     (Blank rows = not tested)   LOWER EXTREMITY ROM:   Passive ROM Right eval Left eval Left  10/20/22  Hip flexion   90 110  Hip internal rotation   5    Hip external rotation   40     (Blank rows = not tested)   LOWER EXTREMITY MMT: Bilateral  hip strength 4/5     PALPATION:   General  tenderness located in the left abdominal region. Tenderness on the pubic bone                 External Perineal Exam tenderness located in the left levator ani, obturator internist                             Internal Pelvic Floor not assessed at this time   Patient confirms identification and approves PT to assess internal pelvic floor and treatment No, will do in the future. Too much pain today.    PELVIC MMT:   MMT eval  Vaginal 2/5                TODAY'S TREATMENT:    10/29/22 Manual: Soft tissue mobilization: Manual work on the ischiocavernosus, bulbocavernosus and along the pubic rami on the left Internal pelvic floor techniques: No emotional/communication barriers or cognitive limitation. Patient is motivated to learn. Patient understands and agrees with treatment goals and plan. PT explains patient will be examined in standing, sitting, and lying down to see how their muscles and joints work. When they are ready, they will be asked to remove their underwear so PT can examine their perineum. The patient is also given the option of providing their own chaperone as one is not provided in  our facility. The patient also has the right and is explained the right to defer or refuse any part of the evaluation or treatment including the internal exam. With the patient's consent, PT will use one gloved finger to gently assess the muscles of the pelvic floor, seeing how well it contracts and relaxes and if there is muscle symmetry. After, the patient will get dressed and PT and patient will discuss exam  findings and plan of care. PT and patient discuss plan of care, schedule, attendance policy and HEP activities.  Going through the vaginal canal working on the right side of the pelvic floor muscles especially around the ischial tuberosity Going through the vaginal canal working on the left levator ani, obturator internist with hip movements; working on the left side of the bladder and urethra with outside hand on the suprapubic region, manual work along the [employer] canal with hip movement, manual work along the coccygeus with out side hand on left buttocks.  Educated patient on how to perform  manual work to the left pelvic floor muscles and gave her lubricant to use.  Release around the cervix, left side of bladder and ovary    10/22/22 Manual: Soft tissue mobilization: Manual work to the left quadratus Manual work to the left hip adductor, left quadriceps, left lateral hip, levator ani, and just above the left knee Spinal mobilization: PA and rotational mobilization to T3-L5 Flexing lumbar spine and gapping the facets Internal pelvic floor techniques: No emotional/communication barriers or cognitive limitation. Patient is motivated to learn. Patient understands and agrees with treatment goals and plan. PT explains patient will be examined in standing, sitting, and lying down to see how their muscles and joints work. When they are ready, they will be asked to remove their underwear so PT can examine their perineum. The patient is also given the option of providing their own chaperone as one is not provided in our facility. The patient also has the right and is explained the right to defer or refuse any part of the evaluation or treatment including the internal exam. With the patient's consent, PT will use one gloved finger to gently assess the muscles of the pelvic floor, seeing how well it contracts and relaxes and if there is muscle symmetry. After, the patient will get dressed and PT and patient  will discuss exam findings and plan of care. PT and patient discuss plan of care, schedule, attendance policy and HEP activities.  Exercises: Stretches/mobility: Lay on foam roll along spine to decompress spine Pelvic tilt on foam roll Supine on foam roll flexion of left shoulder and abduction to open up thoracic lumbar area Open book 10 x on each side Standing hip adductor stretch  T   10/20/22 Manual: Soft tissue mobilization: To assess for dry needling Manual work to left hip adductor, left inguinal area, left gluteal, lumbar paraspinals Myofascial release: Using the suction cup to the lumbar and left guteals, using the isastim to the same Spinal mobilization: PA and rotational mobilization to L1-L5 grade 2-3 Trigger Point Dry-Needling  Treatment instructions: Expect mild to moderate muscle soreness. S/S of pneumothorax if dry needled over a lung field, and to seek immediate medical attention should they occur. Patient verbalized understanding of these instructions and education.   Patient Consent Given: Yes Education handout provided: Previously provided Muscles treated: lumbar multifidi; left glutaeus medius, glutaeus maximus, left hip adductor, left quadratus Electrical stimulation performed: No Parameters: N/A Treatment response/outcome: elongation of muscles and trigger point response  Exercises: Stretches/mobility: Left hamstring stretch in sitting holding for 30 sec Left hip adductor stretch in sitting holding for 30 sec Left piriformis stretch in sitting holding for 30 seconds Educated patient on how to use a ball to massage the left gluteal, pelvic floor and hamstring in stting                                                                                                                                PATIENT EDUCATION:  10/28/22 Education details: Access Code: [email or web address], educated patient on how to perform perineal massage.  Person educated: Patient Education  method: Explanation, Demonstration, Tactile cues, Verbal cues, and Handouts Education comprehension: verbalized understanding, returned demonstration, verbal cues required, tactile cues required, and needs further education   HOME EXERCISE PROGRAM: 10/20/19 Access Code: [email or web address] URL: [email or web address] Date: 10/20/2022 Prepared by: Eulis Foster   Program Notes sit with yoga block and gently move knees forward and backward 1x 2 times per day   Exercises - Child's Pose Stretch  - 1 x daily - 7 x weekly - 3 sets - 10 reps - Cat Cow  - 1 x daily - 7 x weekly - 3 sets - 10 reps - Seated Hamstring Stretch  - 1 x daily - 7 x weekly - 3 sets - 2 reps - 30 sec hold - Seated Hip Adductor Stretch  - 1 x daily - 7 x weekly - 1 sets - 2 reps - 30 se3c hold - Seated Piriformis Stretch with Trunk Bend  - 1 x daily - 7 x weekly - 1 sets - 2 reps - 30 sec hold     ASSESSMENT:   CLINICAL IMPRESSION: Patient is a 37 y.o. female who was seen today for physical therapy evaluation and treatment for pelvic floor dysfunction. Pelvic floor strength Is 1/5. She mainly has trigger points in the left pelvic floor muscles. Patients left foot went numb when the therapist was working on the pelvic floor muscles especially around the Alcock's cana. She was able to walk without a limp on the left when she was done with therapy today. Patient will benefit from skilled therapy to improve her function  and reduce her pain to return to exercise and daily tasks.    OBJECTIVE IMPAIRMENTS: decreased activity tolerance, decreased coordination, decreased endurance, decreased mobility, decreased ROM, decreased strength, increased fascial restrictions, increased muscle spasms, and pain.    ACTIVITY LIMITATIONS: carrying, lifting, bending, sitting, standing, squatting, sleeping, transfers, bed mobility, reach over head, and locomotion level   PARTICIPATION LIMITATIONS: meal prep, cleaning, laundry, interpersonal  relationship, driving, shopping, community activity, and occupation   PERSONAL FACTORS: Past/current experiences, Time since onset of injury/illness/exacerbation, and 1 comorbidity: s/p diagnostic laparoscopy and left salpingectomy for ruptured ectopic pregnancy are also affecting patient's functional outcome.    REHAB POTENTIAL: Excellent   CLINICAL DECISION MAKING: Evolving/moderate complexity   EVALUATION COMPLEXITY: Moderate     GOALS: Goals reviewed with patient?  Yes   SHORT TERM GOALS: Target date: 11/09/22   Assess internal pelvic floor muscles for trigger points and strength.  Baseline:not done yet Goal status: Met 10/28/23   2.  Independent with initial HEP for stretches and diaphragmatic breathing Baseline: not educated yet Goal status: Met  10/22/22     LONG TERM GOALS: Target date: 01/06/23   Independent with advanced HEP for core, hip and pelvic floor strength.  Baseline: Not educated yet Goal status: INITIAL   2.  Full lumbar ROM so patient is able to lift 10# with correct body mechanics and pain level </= 2/10.  Baseline: reduction in lumbar ROM and pain level 10/10 Goal status: INITIAL   3.  POPIQ-7 is </= 10 due to reduction in pain and reduction in frustration of pain limiting her in daily activities.  Baseline: POPIQ-7 76 Goal status: INITIAL   4.  Left hip flexion >/= 110 degrees so she is able to squat with pain level </= 2/10.  Baseline: Left hip flexion 90 degrees Goal status: INITIAL   5.  Patient able to walk for 45 minutes without pain level >/= 2/10 due to left ilium in correct alignment.  Baseline: left ilium is posteriorly rotated, pain level 10/10 Goal status: INITIAL     PLAN:   PT FREQUENCY: 1-2x/week   PT DURATION: 12 weeks   PLANNED INTERVENTIONS: Therapeutic exercises, Therapeutic activity, Neuromuscular re-education, Patient/Family education, Joint mobilization, Dry Needling, Electrical stimulation, Spinal mobilization, Cryotherapy,  Moist heat, Taping, Ultrasound, Biofeedback, and Manual therapy   PLAN FOR NEXT SESSION: correct pelvis, dry needling the back  and left hip adductor ,diaphragmatic breathing to perform pelvic drop, left hip mobilization, manual work to left lumbar and gluteal, manual work suprapubically, see what MD says  Eulis Foster, PT 10/29/22 12:30 PM

## 2022-11-03 ENCOUNTER — Encounter: Payer: Self-pay | Admitting: Physical Therapy

## 2022-11-03 ENCOUNTER — Encounter: Payer: BC Managed Care – PPO | Admitting: Physical Therapy

## 2022-11-03 DIAGNOSIS — M5459 Other low back pain: Secondary | ICD-10-CM | POA: Diagnosis not present

## 2022-11-03 DIAGNOSIS — R102 Pelvic and perineal pain: Secondary | ICD-10-CM

## 2022-11-03 DIAGNOSIS — M62838 Other muscle spasm: Secondary | ICD-10-CM

## 2022-11-03 NOTE — Therapy (Signed)
OUTPATIENT PHYSICAL THERAPY TREATMENT NOTE   Patient Name: Beth Hansen MRN: [id number] DOB:[birth year], 37 y.o., female Today's Date: 11/03/2022  PCP: [facility]  REFERRING PROVIDER: Lennart Pall, MD   END OF SESSION:   PT End of Session - 11/03/22 0835     Visit Number 5    Date for PT Re-Evaluation 01/06/23    Authorization Type [employer]    Authorization Time Period 10/14/2022-01/12/2023    Authorization - Visit Number 4    Authorization - Number of Visits 13    PT Start Time 0830    PT Stop Time 0925    PT Time Calculation (min) 55 min    Activity Tolerance Patient tolerated treatment well    Behavior During Therapy [facility] for tasks assessed/performed             Past Medical History:  Diagnosis Date   Anemia    Past Surgical History:  Procedure Laterality Date   IUD REMOVAL N/A 12/06/2013   Procedure: INTRAUTERINE DEVICE (IUD) REMOVAL;  Surgeon: Lazaro Arms, MD;  Location: [facility];  Service: Gynecology;  Laterality: N/A;   LAPAROSCOPIC TUBAL LIGATION Bilateral 12/06/2013   Procedure: LAPAROSCOPIC BILATERAL TUBAL CAUTERIZATION USING ELECTROCAUTERY;  Surgeon: Lazaro Arms, MD;  Location: [facility];  Service: Gynecology;  Laterality: Bilateral;   LAPAROSCOPIC UNILATERAL SALPINGECTOMY Left 09/11/2022   Procedure: LAPAROSCOPIC LEFT SALPINGECTOMY WITH REMOVAL OF ECTOPIC PREGNANCY;  Surgeon: Lennart Pall, MD;  Location: [facility] OR;  Service: Gynecology;  Laterality: Left;   tubal reversal  03/03/2020   WISDOM TOOTH EXTRACTION     There are no problems to display for this patient. REFERRING DIAG: M62.89 (ICD-10-CM) - Pelvic floor dysfunction   THERAPY DIAG:  Other low back pain - Plan: PT plan of care cert/re-cert   Pelvic pain - Plan: PT plan of care cert/re-cert   Other muscle spasm - Plan: PT plan of care cert/re-cert   Rationale for Evaluation and Treatment: Rehabilitation   ONSET DATE: 09/11/22   SUBJECTIVE:                                                                                                                                                                                             SUBJECTIVE STATEMENT: The internal work helped. I had a pain in my left buttocks and had to take medicine. I see the doctor on Thursday. I am doing the exercises. I feel a lot of pressure in the left hip, groin and into the back.   PAIN:  Are you having pain? Yes NPRS scale: 7/10 at times and low  is 4/10 Pain location: left back and pubic area   Pain type: aching, sharp, and throbbing Pain description: constant    Aggravating factors: movement, walking Relieving factors: heating pad   PRECAUTIONS: None   WEIGHT BEARING RESTRICTIONS: No   FALLS:  Has patient fallen in last 6 months? 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    BOWEL MOVEMENT: Pain with bowel movement: No     URINATION: Pain with urination: No Fully empty bladder: Yes:  Stream: Strong Urgency: No Frequency: average Leakage: none   INTERCOURSE:has not been active since surgery   PREGNANCY: Vaginal deliveries 2 Tearing No       OBJECTIVE:    DIAGNOSTIC FINDINGS:  none   PATIENT SURVEYS:  POPIQ-7 65   COGNITION: Overall cognitive status: Within functional limits for tasks assessed                          SENSATION: Light touch: Appears intact Proprioception: Appears intact     LUMBAR SPECIAL TESTS:  SI Compression/distraction test: Positive, on the left      POSTURE: No Significant postural limitations   PELVIC ALIGNMENT: left ilium rotated posteriorly  11/03/22 left ilium rotated post.    LUMBARAROM/PROM:   A/PROM A/PROM  eval 10/22/22  Flexion  Limited at end range    Extension Decreased by 75% deviating to the right Decreased by 50% without deviation  Right lateral flexion Decreased by 25%    Left lateral flexion full    Right rotation Decreased by 25%    Left rotation Decreased by 25%     (Blank rows = not tested)   LOWER EXTREMITY ROM:   Passive ROM Right eval Left eval Left  10/20/22  Hip flexion   90 110  Hip internal rotation   5    Hip external rotation   40     (Blank rows = not tested)   LOWER EXTREMITY MMT: Bilateral  hip strength 4/5     PALPATION:   General  tenderness located in the left abdominal region. Tenderness on the pubic bone                 External Perineal Exam tenderness located in the left levator ani, obturator internist                             Internal Pelvic Floor not assessed at this time   Patient confirms identification and approves PT to assess internal pelvic floor and treatment No, will do in the future. Too much pain today.    PELVIC MMT:   MMT eval 11/03/22  Vaginal 2/5  1/5                TODAY'S TREATMENT:    11/03/22 Manual: Soft tissue mobilization: To assess for dry needling Manual work to bilateral piriformis, gluteals, and lumbar paraspinals Spinal mobilization: PA and rotational mobilization of L1-L5 grade 3 Sacral mobilization into extension with breath Internal pelvic floor techniques: No emotional/communication barriers or cognitive limitation. Patient is motivated to learn. Patient understands and agrees with treatment goals and plan. PT explains patient will be examined in  standing, sitting, and lying down to see how their muscles and joints work. When they are ready, they will be asked to remove their underwear so PT can examine their perineum. The patient is also given the option of providing their own chaperone as one is not provided in our facility. The patient also has the right and is explained the right to defer or refuse any part of the evaluation  or treatment including the internal exam. With the patient's consent, PT will use one gloved finger to gently assess the muscles of the pelvic floor, seeing how well it contracts and relaxes and if there is muscle symmetry. After, the patient will get dressed and PT and patient will discuss exam findings and plan of care. PT and patient discuss plan of care, schedule, attendance policy and HEP activities.  Going through the vagina working on the obturator internist, iliococcygeus, puborectalis, along the ATLA, and around the ischial tuberosity with hip movement and one hand externally and finger internally working through the trigger points Trigger Point Dry-Needling  Treatment instructions: Expect mild to moderate muscle soreness. S/S of pneumothorax if dry needled over a lung field, and to seek immediate medical attention should they occur. Patient verbalized understanding of these instructions and education.  Patient Consent Given: Yes Education handout provided: Previously provided Muscles treated: piriformis, gluteus medius, gluteus maximus Electrical stimulation performed: No Parameters: N/A Treatment response/outcome: elongation of muscle and trigger point  response  Exercises: Stretches/mobility: 10 press ups Hip ER and adduction in supine with contract relax    10/29/22 Manual: Soft tissue mobilization: Manual work on the ischiocavernosus, bulbocavernosus and along the pubic rami on the left Internal pelvic floor techniques: No emotional/communication barriers or cognitive limitation. Patient is motivated to learn. Patient understands and agrees with treatment goals and plan. PT explains patient will be examined in standing, sitting, and lying down to see how their muscles and joints work. When they are ready, they will be asked to remove their underwear so PT can examine their perineum. The patient is also given the option of providing their own chaperone as one is not provided in our  facility. The patient also has the right and is explained the right to defer or refuse any part of the evaluation or treatment including the internal exam. With the patient's consent, PT will use one gloved finger to gently assess the muscles of the pelvic floor, seeing how well it contracts and relaxes and if there is muscle symmetry. After, the patient will get dressed and PT and patient will discuss exam findings and plan of care. PT and patient discuss plan of care, schedule, attendance policy and HEP activities.  Going through the vaginal canal working on the right side of the pelvic floor muscles especially around the ischial tuberosity Going through the vaginal canal working on the left levator ani, obturator internist with hip movements; working on the left side of the bladder and urethra with outside hand on the suprapubic region, manual work along the [employer] canal with hip movement, manual work along the coccygeus with out side hand on left buttocks.  Educated patient on how to perform  manual work to the left pelvic floor muscles and gave her lubricant to use.  Release around the cervix, left side of bladder and ovary     10/22/22 Manual: Soft tissue mobilization: Manual work to the left quadratus Manual work to the left hip adductor, left quadriceps, left lateral hip, levator ani, and just above the left knee  Spinal mobilization: PA and rotational mobilization to T3-L5 Flexing lumbar spine and gapping the facets Internal pelvic floor techniques: No emotional/communication barriers or cognitive limitation. Patient is motivated to learn. Patient understands and agrees with treatment goals and plan. PT explains patient will be examined in standing, sitting, and lying down to see how their muscles and joints work. When they are ready, they will be asked to remove their underwear so PT can examine their perineum. The patient is also given the option of providing their own chaperone as one is  not provided in our facility. The patient also has the right and is explained the right to defer or refuse any part of the evaluation or treatment including the internal exam. With the patient's consent, PT will use one gloved finger to gently assess the muscles of the pelvic floor, seeing how well it contracts and relaxes and if there is muscle symmetry. After, the patient will get dressed and PT and patient will discuss exam findings and plan of care. PT and patient discuss plan of care, schedule, attendance policy and HEP activities.  Exercises: Stretches/mobility: Lay on foam roll along spine to decompress spine Pelvic tilt on foam roll Supine on foam roll flexion of left shoulder and abduction to open up thoracic lumbar area Open book 10 x on each side Standing hip adductor stretch                                                                                                                                 PATIENT EDUCATION:  10/28/22 Education details: Access Code: [email or web address], educated patient on how to perform perineal massage.  Person educated: Patient Education method: Explanation, Demonstration, Tactile cues, Verbal cues, and Handouts Education comprehension: verbalized understanding, returned demonstration, verbal cues required, tactile cues required, and needs further education   HOME EXERCISE PROGRAM: 10/20/19 Access Code: [email or web address] URL: [email or web address] Date: 10/20/2022 Prepared by: Eulis Foster   Program Notes sit with yoga block and gently move knees forward and backward 1x 2 times per day   Exercises - Child's Pose Stretch  - 1 x daily - 7 x weekly - 3 sets - 10 reps - Cat Cow  - 1 x daily - 7 x weekly - 3 sets - 10 reps - Seated Hamstring Stretch  - 1 x daily - 7 x weekly - 3 sets - 2 reps - 30 sec hold - Seated Hip Adductor Stretch  - 1 x daily - 7 x weekly - 1 sets - 2 reps - 30 se3c hold - Seated Piriformis Stretch with Trunk Bend  - 1 x daily - 7 x  weekly - 1 sets - 2 reps - 30 sec hold     ASSESSMENT:   CLINICAL IMPRESSION: Patient is a 37 y.o. female who was seen today for physical therapy evaluation and treatment for pelvic floor dysfunction. Pelvic floor strength Is 1/5. Patient did not have numbness in the left foot  with manual work today. She has many trigger points in the pelvic floor and gluteals. She is doing her HEP. Left hip flexion is 110 degrees of flexion. Left ilium continues to be posteriorly rotated.  Patient will benefit from skilled therapy to improve her function  and reduce her pain to return to exercise and daily tasks.    OBJECTIVE IMPAIRMENTS: decreased activity tolerance, decreased coordination, decreased endurance, decreased mobility, decreased ROM, decreased strength, increased fascial restrictions, increased muscle spasms, and pain.    ACTIVITY LIMITATIONS: carrying, lifting, bending, sitting, standing, squatting, sleeping, transfers, bed mobility, reach over head, and locomotion level   PARTICIPATION LIMITATIONS: meal prep, cleaning, laundry, interpersonal relationship, driving, shopping, community activity, and occupation   PERSONAL FACTORS: Past/current experiences, Time since onset of injury/illness/exacerbation, and 1 comorbidity: s/p diagnostic laparoscopy and left salpingectomy for ruptured ectopic pregnancy are also affecting patient's functional outcome.    REHAB POTENTIAL: Excellent   CLINICAL DECISION MAKING: Evolving/moderate complexity   EVALUATION COMPLEXITY: Moderate     GOALS: Goals reviewed with patient? Yes   SHORT TERM GOALS: Target date: 11/09/22   Assess internal pelvic floor muscles for trigger points and strength.  Baseline:not done yet Goal status: Met 10/28/23   2.  Independent with initial HEP for stretches and diaphragmatic breathing Baseline: not educated yet Goal status: Met  10/22/22     LONG TERM GOALS: Target date: 01/06/23   Independent with advanced HEP for core, hip  and pelvic floor strength.  Baseline: Not educated yet Goal status: INITIAL   2.  Full lumbar ROM so patient is able to lift 10# with correct body mechanics and pain level </= 2/10.  Baseline: reduction in lumbar ROM and pain level 10/10 Goal status: INITIAL   3.  POPIQ-7 is </= 10 due to reduction in pain and reduction in frustration of pain limiting her in daily activities.  Baseline: POPIQ-7 76 Goal status: INITIAL   4.  Left hip flexion >/= 110 degrees so she is able to squat with pain level </= 2/10.  Baseline: Left hip flexion 90 degrees Goal status: INITIAL   5.  Patient able to walk for 45 minutes without pain level >/= 2/10 due to left ilium in correct alignment.  Baseline: left ilium is posteriorly rotated, pain level 10/10 Goal status: INITIAL     PLAN:   PT FREQUENCY: 1-2x/week   PT DURATION: 12 weeks   PLANNED INTERVENTIONS: Therapeutic exercises, Therapeutic activity, Neuromuscular re-education, Patient/Family education, Joint mobilization, Dry Needling, Electrical stimulation, Spinal mobilization, Cryotherapy, Moist heat, Taping, Ultrasound, Biofeedback, and Manual therapy   PLAN FOR NEXT SESSION: correct pelvis,  left hip adductor ,diaphragmatic breathing to perform pelvic drop, left hip mobilization for posterior release, internal work, see what MD says    Eulis Foster, PT 11/03/22 9:35 AM

## 2022-11-04 ENCOUNTER — Ambulatory Visit
Admission: RE | Admit: 2022-11-04 | Discharge: 2022-11-04 | Disposition: A | Discharge status: Home / Self Care | Payer: BC Managed Care – PPO | Source: Ambulatory Visit | Attending: Nurse Practitioner | Admitting: Nurse Practitioner

## 2022-11-04 DIAGNOSIS — R946 Abnormal results of thyroid function studies: Secondary | ICD-10-CM

## 2022-11-05 ENCOUNTER — Other Ambulatory Visit: Payer: Self-pay | Admitting: Nurse Practitioner

## 2022-11-05 ENCOUNTER — Encounter: Payer: Self-pay | Admitting: Nurse Practitioner

## 2022-11-05 DIAGNOSIS — C50012 Malignant neoplasm of nipple and areola, left female breast: Secondary | ICD-10-CM

## 2022-11-10 ENCOUNTER — Telehealth: Payer: Self-pay | Admitting: Physical Therapy

## 2022-11-10 ENCOUNTER — Encounter: Payer: BC Managed Care – PPO | Attending: Obstetrics and Gynecology | Admitting: Physical Therapy

## 2022-11-10 DIAGNOSIS — M62838 Other muscle spasm: Secondary | ICD-10-CM | POA: Insufficient documentation

## 2022-11-10 DIAGNOSIS — R102 Pelvic and perineal pain: Secondary | ICD-10-CM | POA: Insufficient documentation

## 2022-11-10 DIAGNOSIS — M5459 Other low back pain: Secondary | ICD-10-CM | POA: Insufficient documentation

## 2022-11-10 NOTE — Telephone Encounter (Signed)
Called patient about her apptointment she missed today at 8:30.  Left a message.  Eulis Foster, PT @5 /7/24@ 8:49 AM

## 2022-11-12 ENCOUNTER — Encounter: Payer: BC Managed Care – PPO | Admitting: Physical Therapy

## 2022-11-12 ENCOUNTER — Telehealth: Payer: Self-pay | Admitting: Physical Therapy

## 2022-11-12 NOTE — Telephone Encounter (Signed)
Called patient about her missed appointment today at 11:30. Left a message.  Eulis Foster, PT @5 /9/24@ 11:46 AM

## 2022-11-17 ENCOUNTER — Encounter: Payer: BC Managed Care – PPO | Admitting: Physical Therapy

## 2022-11-18 ENCOUNTER — Telehealth: Payer: Self-pay | Admitting: Physical Therapy

## 2022-11-18 NOTE — Telephone Encounter (Signed)
Left a message to patient to see if she is to continue therapy.  Eulis Foster, PT @5 /15/24@ 12:38 PM

## 2022-11-26 ENCOUNTER — Encounter: Payer: BC Managed Care – PPO | Admitting: Physical Therapy

## 2022-11-26 ENCOUNTER — Encounter: Payer: Self-pay | Admitting: Physical Therapy

## 2022-11-26 DIAGNOSIS — M5459 Other low back pain: Secondary | ICD-10-CM | POA: Diagnosis present

## 2022-11-26 DIAGNOSIS — M62838 Other muscle spasm: Secondary | ICD-10-CM

## 2022-11-26 DIAGNOSIS — R102 Pelvic and perineal pain: Secondary | ICD-10-CM | POA: Diagnosis present

## 2022-11-26 NOTE — Therapy (Signed)
OUTPATIENT PHYSICAL THERAPY TREATMENT NOTE   Patient Name: Beth Hansen MRN: [id number] DOB:12-13-1985, 37 y.o., female Today's Date: 11/26/2022  PCP: [facility]  REFERRING PROVIDER: Lennart Pall, MD   END OF SESSION:   PT End of Session - 11/26/22 1359     Visit Number 6    Date for PT Re-Evaluation 01/06/23    Authorization Type [employer]    Authorization Time Period 10/14/2022-01/12/2023    Authorization - Visit Number 6    Authorization - Number of Visits 13    PT Start Time 1400    PT Stop Time 1445    PT Time Calculation (min) 45 min    Activity Tolerance Patient tolerated treatment well    Behavior During Therapy WFL for tasks assessed/performed             Past Medical History:  Diagnosis Date   Anemia    Past Surgical History:  Procedure Laterality Date   IUD REMOVAL N/A 12/06/2013   Procedure: INTRAUTERINE DEVICE (IUD) REMOVAL;  Surgeon: Lazaro Arms, MD;  Location: [facility];  Service: Gynecology;  Laterality: N/A;   LAPAROSCOPIC TUBAL LIGATION Bilateral 12/06/2013   Procedure: LAPAROSCOPIC BILATERAL TUBAL CAUTERIZATION USING ELECTROCAUTERY;  Surgeon: Lazaro Arms, MD;  Location: [facility];  Service: Gynecology;  Laterality: Bilateral;   LAPAROSCOPIC UNILATERAL SALPINGECTOMY Left 09/11/2022   Procedure: LAPAROSCOPIC LEFT SALPINGECTOMY WITH REMOVAL OF ECTOPIC PREGNANCY;  Surgeon: Lennart Pall, MD;  Location: [facility] OR;  Service: Gynecology;  Laterality: Left;   tubal reversal  03/03/2020   WISDOM TOOTH EXTRACTION     There are no problems to display for this patient. REFERRING DIAG: M62.89 (ICD-10-CM) - Pelvic floor dysfunction   THERAPY DIAG:  Other low back pain - Plan: PT plan of care cert/re-cert   Pelvic pain - Plan: PT plan of care cert/re-cert   Other muscle spasm - Plan: PT plan of care cert/re-cert   Rationale for Evaluation and Treatment: Rehabilitation   ONSET DATE: 09/11/22   SUBJECTIVE:                                                                                                                                                                                             SUBJECTIVE STATEMENT: I was not able to come to therapy due to my mom was passing away. I am not sleeping lately. I hurt all over.  I used the wooden roller on my back. I have a lot of back pain. Patient has increased vaginal discharged when she feels a vibration in the vaginal area. Patient has less feeling in  the saddle area.   PAIN:  Are you having pain? Yes NPRS scale: 8/10 at times and low is 4/10 Pain location: left back and pubic area   Pain type: aching, sharp, and throbbing Pain description: constant    Aggravating factors: movement, walking Relieving factors: heating pad   PRECAUTIONS: None   WEIGHT BEARING RESTRICTIONS: No   FALLS:  Has patient fallen in last 6 months? 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    BOWEL MOVEMENT: Pain with bowel movement: No     URINATION: Pain with urination: No Fully empty bladder: Yes:  Stream: Strong Urgency: No Frequency: average Leakage: none   INTERCOURSE:has not been active since surgery   PREGNANCY: Vaginal deliveries 2 Tearing No       OBJECTIVE:    DIAGNOSTIC FINDINGS:  none   PATIENT SURVEYS:  POPIQ-7 33   COGNITION: Overall cognitive status: Within functional limits for tasks assessed                          SENSATION: Light touch: Appears intact Proprioception: Appears intact     LUMBAR SPECIAL TESTS:  SI Compression/distraction test: Positive, on the left      POSTURE: No Significant postural limitations   PELVIC ALIGNMENT: left ilium rotated posteriorly   11/03/22 left ilium rotated post.    LUMBARAROM/PROM:   A/PROM A/PROM  eval 10/22/22 11/26/22  Flexion Limited at end range   Limited at end range  Extension Decreased by 75% deviating to the right Decreased by 50% without deviation Decreased by 50% without deviation  Right lateral flexion Decreased by 25%   Decreased by 25%  Left lateral flexion full   full  Right rotation Decreased by 25%   full  Left rotation Decreased by 25%   Decreased by 25%   (Blank rows = not tested)   LOWER EXTREMITY ROM:   Passive ROM Right eval Left eval Left  10/20/22 Left 11/26/22  Hip flexion   90 110 110  Hip internal rotation   5   25  Hip external rotation   40   70   (Blank rows = not tested)   LOWER EXTREMITY MMT: Bilateral  hip strength 4/5     PALPATION:   General  tenderness located in the left abdominal region. Tenderness on the pubic bone                 External Perineal Exam tenderness located in the left levator ani, obturator internist                             Internal Pelvic Floor not assessed at this time   Patient confirms identification and approves PT to assess internal pelvic floor and treatment No, will do in the future. Too much pain today.    PELVIC MMT:   MMT eval 11/03/22 11/26/22  Vaginal 2/5  1/5 2/5                TODAY'S TREATMENT:    11/26/22 Manual: Spinal mobilization: PA and rotational mobilization of T5-L5 grade 3  Downward glide of the right SI joint grade 3  Internal pelvic floor techniques: No emotional/communication barriers or cognitive limitation. Patient is motivated to learn. Patient understands and agrees with treatment goals and plan. PT explains patient will be examined in standing, sitting, and lying down to see how their muscles and joints work. When they are ready, they will be asked to remove their underwear so PT can examine their perineum. The patient is also given the option of providing their own chaperone as one is not provided in  our facility. The patient also has the right and is explained the right to defer or refuse any part of the evaluation or treatment including the internal exam. With the patient's consent, PT will use one gloved finger to gently assess the muscles of the pelvic floor, seeing how well it contracts and relaxes and if there is muscle symmetry. After, the patient will get dressed and PT and patient will discuss exam findings and plan of care. PT and patient discuss plan of care, schedule, attendance policy and HEP activities.  Working on the [employer], Acock's canal, obturator internist, ischiocavernosus, bulbocavernosus, sides of the bladder, working around the left fallopian tube with one finger internally through the vaginal canal and other along the lower abdomen to get fascial releases Neuromuscular re-education: Pelvic floor contraction training: Worked on pelvic floor contraction but at time would cause a cramp in the muscles due to them being so tight  11/03/22 Manual: Soft tissue mobilization: To assess for dry needling Manual work to bilateral piriformis, gluteals, and lumbar paraspinals Spinal mobilization: PA and rotational mobilization of L1-L5 grade 3 Sacral mobilization into extension with breath Internal pelvic floor techniques: No emotional/communication barriers or cognitive limitation. Patient is motivated to learn. Patient understands and agrees with treatment goals and plan. PT explains patient will be examined in standing, sitting, and lying down to see how their muscles and joints work. When they are ready, they will be asked to remove their underwear so PT can examine their perineum. The patient is also given the option of providing their own chaperone as one is not provided in our facility. The patient also has the right and is explained the right to defer or refuse any part of the evaluation or treatment including the internal exam. With the patient's consent, PT will use one  gloved finger to gently assess the muscles of the pelvic floor, seeing how well it contracts and relaxes and if there is muscle symmetry. After, the patient will get dressed and PT and patient will discuss exam findings and plan of care. PT and patient discuss plan of care, schedule, attendance policy and HEP activities.  Going through the vagina working on the obturator internist, iliococcygeus, puborectalis, along the ATLA, and around the ischial tuberosity with hip movement and one hand externally and finger internally working through the trigger points Trigger Point Dry-Needling  Treatment instructions: Expect mild to moderate muscle soreness. S/S of pneumothorax if dry needled over a lung field, and to seek immediate medical attention should they occur. Patient verbalized understanding of these instructions and education.  Patient Consent Given: Yes Education handout provided: Previously provided Muscles treated: piriformis, gluteus medius, gluteus maximus Electrical stimulation performed: No Parameters: N/A Treatment response/outcome: elongation of muscle and trigger point  response  Exercises: Stretches/mobility: 10 press ups Hip ER and adduction in supine with contract relax    10/29/22 Manual: Soft tissue mobilization: Manual work on the ischiocavernosus, bulbocavernosus and along the pubic rami on the left Internal pelvic floor techniques: No emotional/communication barriers or cognitive  limitation. Patient is motivated to learn. Patient understands and agrees with treatment goals and plan. PT explains patient will be examined in standing, sitting, and lying down to see how their muscles and joints work. When they are ready, they will be asked to remove their underwear so PT can examine their perineum. The patient is also given the option of providing their own chaperone as one is not provided in our facility. The patient also has the right and is explained the right to defer or refuse  any part of the evaluation or treatment including the internal exam. With the patient's consent, PT will use one gloved finger to gently assess the muscles of the pelvic floor, seeing how well it contracts and relaxes and if there is muscle symmetry. After, the patient will get dressed and PT and patient will discuss exam findings and plan of care. PT and patient discuss plan of care, schedule, attendance policy and HEP activities.  Going through the vaginal canal working on the right side of the pelvic floor muscles especially around the ischial tuberosity Going through the vaginal canal working on the left levator ani, obturator internist with hip movements; working on the left side of the bladder and urethra with outside hand on the suprapubic region, manual work along the [employer] canal with hip movement, manual work along the coccygeus with out side hand on left buttocks.  Educated patient on how to perform  manual work to the left pelvic floor muscles and gave her lubricant to use.  Release around the cervix, left side of bladder and ovary                                                                                                                                 PATIENT EDUCATION:  10/28/22 Education details: Access Code: [email or web address], educated patient on how to perform perineal massage.  Person educated: Patient Education method: Explanation, Demonstration, Tactile cues, Verbal cues, and Handouts Education comprehension: verbalized understanding, returned demonstration, verbal cues required, tactile cues required, and needs further education   HOME EXERCISE PROGRAM: 10/20/19 Access Code: [email or web address] URL: [email or web address] Date: 10/20/2022 Prepared by: Eulis Foster   Program Notes sit with yoga block and gently move knees forward and backward 1x 2 times per day   Exercises - Child's Pose Stretch  - 1 x daily - 7 x weekly - 3 sets - 10 reps - Cat Cow  - 1 x daily - 7 x  weekly - 3 sets - 10 reps - Seated Hamstring Stretch  - 1 x daily - 7 x weekly - 3 sets - 2 reps - 30 sec hold - Seated Hip Adductor Stretch  - 1 x daily - 7 x weekly - 1 sets - 2 reps - 30 se3c hold - Seated Piriformis Stretch with Trunk Bend  - 1 x daily - 7 x weekly - 1 sets - 2 reps - 30 sec hold  ASSESSMENT:   CLINICAL IMPRESSION: Patient is a 37 y.o. female who was seen today for physical therapy evaluation and treatment for pelvic floor dysfunction. Pelvic floor strength Is 2/5. Patient had increased in lumbar ROM after the manual work and decreased to 4/10 compared to 8/10. She would get numbness in the right foot when therapist was working on the right Acock's canal due to the pudendal nerve going through there. She has decreased feeling in the saddle area. Patient gets cramps in the pelvic floor with contraction due to the tightness in the muscles. She has increased in left hip ROM.  Patient will benefit from skilled therapy to improve her function  and reduce her pain to return to exercise and daily tasks.    OBJECTIVE IMPAIRMENTS: decreased activity tolerance, decreased coordination, decreased endurance, decreased mobility, decreased ROM, decreased strength, increased fascial restrictions, increased muscle spasms, and pain.    ACTIVITY LIMITATIONS: carrying, lifting, bending, sitting, standing, squatting, sleeping, transfers, bed mobility, reach over head, and locomotion level   PARTICIPATION LIMITATIONS: meal prep, cleaning, laundry, interpersonal relationship, driving, shopping, community activity, and occupation   PERSONAL FACTORS: Past/current experiences, Time since onset of injury/illness/exacerbation, and 1 comorbidity: s/p diagnostic laparoscopy and left salpingectomy for ruptured ectopic pregnancy are also affecting patient's functional outcome.    REHAB POTENTIAL: Excellent   CLINICAL DECISION MAKING: Evolving/moderate complexity   EVALUATION COMPLEXITY: Moderate      GOALS: Goals reviewed with patient? Yes   SHORT TERM GOALS: Target date: 11/09/22   Assess internal pelvic floor muscles for trigger points and strength.  Baseline:not done yet Goal status: Met 10/28/23   2.  Independent with initial HEP for stretches and diaphragmatic breathing Baseline: not educated yet Goal status: Met  10/22/22     LONG TERM GOALS: Target date: 01/06/23   Independent with advanced HEP for core, hip and pelvic floor strength.  Baseline: Not educated yet Goal status: INITIAL   2.  Full lumbar ROM so patient is able to lift 10# with correct body mechanics and pain level </= 2/10.  Baseline: reduction in lumbar ROM and pain level 10/10 Goal status: INITIAL   3.  POPIQ-7 is </= 10 due to reduction in pain and reduction in frustration of pain limiting her in daily activities.  Baseline: POPIQ-7 76 Goal status: INITIAL   4.  Left hip flexion >/= 110 degrees so she is able to squat with pain level </= 2/10.  Baseline: Left hip flexion 110 degrees Goal status: INITIAL   5.  Patient able to walk for 45 minutes without pain level >/= 2/10 due to left ilium in correct alignment.  Baseline: left ilium is posteriorly rotated, pain level 10/10 Goal status: INITIAL     PLAN:   PT FREQUENCY: 1-2x/week   PT DURATION: 12 weeks   PLANNED INTERVENTIONS: Therapeutic exercises, Therapeutic activity, Neuromuscular re-education, Patient/Family education, Joint mobilization, Dry Needling, Electrical stimulation, Spinal mobilization, Cryotherapy, Moist heat, Taping, Ultrasound, Biofeedback, and Manual therapy   PLAN FOR NEXT SESSION: correct pelvis,  ldiaphragmatic breathing to perform pelvic drop, internal work, see what MD says    Eulis Foster, PT 11/26/22 3:03 PM

## 2022-11-27 ENCOUNTER — Encounter: Payer: BC Managed Care – PPO | Admitting: Physical Therapy

## 2022-12-01 ENCOUNTER — Encounter: Payer: Self-pay | Admitting: Physical Therapy

## 2022-12-01 ENCOUNTER — Encounter: Payer: BC Managed Care – PPO | Admitting: Physical Therapy

## 2022-12-01 DIAGNOSIS — M62838 Other muscle spasm: Secondary | ICD-10-CM

## 2022-12-01 DIAGNOSIS — M5459 Other low back pain: Secondary | ICD-10-CM

## 2022-12-01 DIAGNOSIS — R102 Pelvic and perineal pain: Secondary | ICD-10-CM

## 2022-12-01 NOTE — Therapy (Signed)
OUTPATIENT PHYSICAL THERAPY TREATMENT NOTE   Patient Name: Beth Hansen MRN: [id number] DOB:1985/08/16, 37 y.o., female Today's Date: 12/01/2022  PCP: [facility]  REFERRING PROVIDER: Lennart Pall, MD   END OF SESSION:   PT End of Session - 12/01/22 0944     Visit Number 7    Date for PT Re-Evaluation 01/06/23    Authorization Type [employer]    Authorization Time Period 10/14/2022-01/12/2023    Authorization - Visit Number 7    Authorization - Number of Visits 13    PT Start Time [phone]    PT Stop Time 1025    PT Time Calculation (min) 43 min    Activity Tolerance Patient tolerated treatment well    Behavior During Therapy [facility] for tasks assessed/performed             Past Medical History:  Diagnosis Date   Anemia    Past Surgical History:  Procedure Laterality Date   IUD REMOVAL N/A 12/06/2013   Procedure: INTRAUTERINE DEVICE (IUD) REMOVAL;  Surgeon: Lazaro Arms, MD;  Location: [facility];  Service: Gynecology;  Laterality: N/A;   LAPAROSCOPIC TUBAL LIGATION Bilateral 12/06/2013   Procedure: LAPAROSCOPIC BILATERAL TUBAL CAUTERIZATION USING ELECTROCAUTERY;  Surgeon: Lazaro Arms, MD;  Location: [facility];  Service: Gynecology;  Laterality: Bilateral;   LAPAROSCOPIC UNILATERAL SALPINGECTOMY Left 09/11/2022   Procedure: LAPAROSCOPIC LEFT SALPINGECTOMY WITH REMOVAL OF ECTOPIC PREGNANCY;  Surgeon: Lennart Pall, MD;  Location: [facility] OR;  Service: Gynecology;  Laterality: Left;   tubal reversal  03/03/2020   WISDOM TOOTH EXTRACTION     There are no problems to display for this patient. REFERRING DIAG: M62.89 (ICD-10-CM) - Pelvic floor dysfunction   THERAPY DIAG:  Other low back pain - Plan: PT plan of care cert/re-cert   Pelvic pain - Plan: PT plan of care cert/re-cert   Other muscle spasm - Plan: PT plan of care cert/re-cert   Rationale for Evaluation and Treatment: Rehabilitation   ONSET DATE: 09/11/22   SUBJECTIVE:                                                                                                                                                                                             SUBJECTIVE STATEMENT: I see the MD on 12/03/22. I felt better since last visit. I am not in pain just stiff.    PAIN:  Are you having pain? Yes NPRS scale: 0/10  Pain location: left back and pubic area   Pain type: aching, sharp, and throbbing Pain description: constant    Aggravating factors: movement, walking Relieving  factors: heating pad   PRECAUTIONS: None   WEIGHT BEARING RESTRICTIONS: No   FALLS:  Has patient fallen in last 6 months? 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    BOWEL MOVEMENT: Pain with bowel movement: No     URINATION: Pain with urination: No Fully empty bladder: Yes:  Stream: Strong Urgency: No Frequency: average Leakage: none   INTERCOURSE:has not been active since surgery   PREGNANCY: Vaginal deliveries 2 Tearing No       OBJECTIVE:    DIAGNOSTIC FINDINGS:  none   PATIENT SURVEYS:  POPIQ-7 20   COGNITION: Overall cognitive status: Within functional limits for tasks assessed                          SENSATION: Light touch: Appears intact Proprioception: Appears intact     LUMBAR SPECIAL TESTS:  SI Compression/distraction test: Positive, on the left      POSTURE: No Significant postural limitations   PELVIC ALIGNMENT: left ilium rotated posteriorly  11/03/22 left ilium rotated post.  12/01/22 pelvis in correct alignment   LUMBARAROM/PROM:   A/PROM A/PROM  eval 10/22/22 11/26/22  Flexion Limited at end range   Limited at end range  Extension Decreased by 75% deviating to the right  Decreased by 50% without deviation Decreased by 50% without deviation  Right lateral flexion Decreased by 25%   Decreased by 25%  Left lateral flexion full   full  Right rotation Decreased by 25%   full  Left rotation Decreased by 25%   Decreased by 25%   (Blank rows = not tested)   LOWER EXTREMITY ROM:   Passive ROM Right eval Left eval Left  10/20/22 Left 11/26/22  Hip flexion   90 110 110  Hip internal rotation   5   25  Hip external rotation   40   70   (Blank rows = not tested)   LOWER EXTREMITY MMT: Bilateral  hip strength 4/5     PALPATION:   General  tenderness located in the left abdominal region. Tenderness on the pubic bone                 External Perineal Exam tenderness located in the left levator ani, obturator internist                             Internal Pelvic Floor not assessed at this time   Patient confirms identification and approves PT to assess internal pelvic floor and treatment No, will do in the future. Too much pain today.    PELVIC MMT:   MMT eval 11/03/22 11/26/22  Vaginal 2/5  1/5 2/5                TODAY'S TREATMENT:    12/01/22 Manual: Spinal mobilization: Mobilized L2 in sidely Internal pelvic floor techniques: No emotional/communication barriers or cognitive limitation. Patient is motivated to learn. Patient understands and agrees with treatment goals and plan. PT explains patient will be examined in standing, sitting, and lying down to see how their muscles and joints work. When they are ready, they will be asked to remove their underwear  so PT can examine their perineum. The patient is also given the option of providing their own chaperone as one is not provided in our facility. The patient also has the right and is explained the right to defer or refuse any part of the evaluation or treatment including the internal exam. With the patient's consent, PT will use one gloved finger to gently assess the muscles of the pelvic floor, seeing how  well it contracts and relaxes and if there is muscle symmetry. After, the patient will get dressed and PT and patient will discuss exam findings and plan of care. PT and patient discuss plan of care, schedule, attendance policy and HEP activities.  Going through the vagina working on the obturator internist with contract relax and hip movements Going through the vagina and releasing around the bladder and fallopian tube areas Neuromuscular re-education: Pelvic floor contraction training: Worked on pelvic floor contraction but increased soreness in the pelvic floor muscles Down training: Diaphragmatic breathing to lengthen the pelvic floor with therapist finger in the vaginal canal to feel the relaxation of the pelvic floor 1/2 kneel moving forward and back with therapist pulling the hip backwards and laterally to lengthen the pelvic floor    11/26/22 Manual: Spinal mobilization: PA and rotational mobilization of T5-L5 grade 3  Downward glide of the right SI joint grade 3  Internal pelvic floor techniques: No emotional/communication barriers or cognitive limitation. Patient is motivated to learn. Patient understands and agrees with treatment goals and plan. PT explains patient will be examined in standing, sitting, and lying down to see how their muscles and joints work. When they are ready, they will be asked to remove their underwear so PT can examine their perineum. The patient is also given the option of providing their own chaperone as one is not provided in our facility. The patient also has the right and is explained the right to defer or refuse any part of the evaluation or treatment including the internal exam. With the patient's consent, PT will use one gloved finger to gently assess the muscles of the pelvic floor, seeing how well it contracts and relaxes and if there is muscle symmetry. After, the patient will get dressed and PT and patient will discuss exam findings and plan of care. PT  and patient discuss plan of care, schedule, attendance policy and HEP activities.  Working on the [employer], [place]'s canal, obturator internist, ischiocavernosus, bulbocavernosus, sides of the bladder, working around the left fallopian tube with one finger internally through the vaginal canal and other along the lower abdomen to get fascial releases Neuromuscular re-education: Pelvic floor contraction training: Worked on pelvic floor contraction but at time would cause a cramp in the muscles due to them being so tight  11/03/22 Manual: Soft tissue mobilization: To assess for dry needling Manual work to bilateral piriformis, gluteals, and lumbar paraspinals Spinal mobilization: PA and rotational mobilization of L1-L5 grade 3 Sacral mobilization into extension with breath Internal pelvic floor techniques: No emotional/communication barriers or cognitive limitation. Patient is motivated to learn. Patient understands and agrees with treatment goals and plan. PT explains patient will be examined in standing, sitting, and lying down to see how their muscles and joints work. When they are ready, they will be asked to remove their underwear so PT can examine their perineum. The patient is also given the option of providing their own chaperone as one is not provided in our facility. The patient also has the right and is explained the  right to defer or refuse any part of the evaluation or treatment including the internal exam. With the patient's consent, PT will use one gloved finger to gently assess the muscles of the pelvic floor, seeing how well it contracts and relaxes and if there is muscle symmetry. After, the patient will get dressed and PT and patient will discuss exam findings and plan of care. PT and patient discuss plan of care, schedule, attendance policy and HEP activities.  Going through the vagina working on the obturator internist, iliococcygeus, puborectalis, along the ATLA, and around the  ischial tuberosity with hip movement and one hand externally and finger internally working through the trigger points Trigger Point Dry-Needling  Treatment instructions: Expect mild to moderate muscle soreness. S/S of pneumothorax if dry needled over a lung field, and to seek immediate medical attention should they occur. Patient verbalized understanding of these instructions and education.  Patient Consent Given: Yes Education handout provided: Previously provided Muscles treated: piriformis, gluteus medius, gluteus maximus Electrical stimulation performed: No Parameters: N/A Treatment response/outcome: elongation of muscle and trigger point  response  Exercises: Stretches/mobility: 10 press ups Hip ER and adduction in supine with contract relax                                                                                                                                 PATIENT EDUCATION:  10/28/22 Education details: Access Code: [email or web address], educated patient on how to perform perineal massage.  Person educated: Patient Education method: Explanation, Demonstration, Tactile cues, Verbal cues, and Handouts Education comprehension: verbalized understanding, returned demonstration, verbal cues required, tactile cues required, and needs further education   HOME EXERCISE PROGRAM: 10/20/19 Access Code: [email or web address] URL: [email or web address] Date: 10/20/2022 Prepared by: Eulis Foster   Program Notes sit with yoga block and gently move knees forward and backward 1x 2 times per day   Exercises - Child's Pose Stretch  - 1 x daily - 7 x weekly - 3 sets - 10 reps - Cat Cow  - 1 x daily - 7 x weekly - 3 sets - 10 reps - Seated Hamstring Stretch  - 1 x daily - 7 x weekly - 3 sets - 2 reps - 30 sec hold - Seated Hip Adductor Stretch  - 1 x daily - 7 x weekly - 1 sets - 2 reps - 30 se3c hold - Seated Piriformis Stretch with Trunk Bend  - 1 x daily - 7 x weekly - 1 sets - 2 reps - 30 sec  hold     ASSESSMENT:   CLINICAL IMPRESSION: Patient is a 37 y.o. female who was seen today for physical therapy evaluation and treatment for pelvic floor dysfunction. Patient was able to perform pelvic drop with her breathing now. She is not able to work on pelvic floor contraction due to increases soreness. She came in today with no pain just stiffness for the first time. She did have numbness down  right leg when therapist touched the Alcock's canal.   She has improved spinal mobility. Patient will benefit from skilled therapy to improve her function  and reduce her pain to return to exercise and daily tasks.    OBJECTIVE IMPAIRMENTS: decreased activity tolerance, decreased coordination, decreased endurance, decreased mobility, decreased ROM, decreased strength, increased fascial restrictions, increased muscle spasms, and pain.    ACTIVITY LIMITATIONS: carrying, lifting, bending, sitting, standing, squatting, sleeping, transfers, bed mobility, reach over head, and locomotion level   PARTICIPATION LIMITATIONS: meal prep, cleaning, laundry, interpersonal relationship, driving, shopping, community activity, and occupation   PERSONAL FACTORS: Past/current experiences, Time since onset of injury/illness/exacerbation, and 1 comorbidity: s/p diagnostic laparoscopy and left salpingectomy for ruptured ectopic pregnancy are also affecting patient's functional outcome.    REHAB POTENTIAL: Excellent   CLINICAL DECISION MAKING: Evolving/moderate complexity   EVALUATION COMPLEXITY: Moderate     GOALS: Goals reviewed with patient? Yes   SHORT TERM GOALS: Target date: 11/09/22   Assess internal pelvic floor muscles for trigger points and strength.  Baseline:not done yet Goal status: Met 10/28/23   2.  Independent with initial HEP for stretches and diaphragmatic breathing Baseline: not educated yet Goal status: Met  10/22/22     LONG TERM GOALS: Target date: 01/06/23   Independent with advanced HEP  for core, hip and pelvic floor strength.  Baseline: Not educated yet Goal status: INITIAL   2.  Full lumbar ROM so patient is able to lift 10# with correct body mechanics and pain level </= 2/10.  Baseline: reduction in lumbar ROM and pain level 10/10 Goal status: INITIAL   3.  POPIQ-7 is </= 10 due to reduction in pain and reduction in frustration of pain limiting her in daily activities.  Baseline: POPIQ-7 76 Goal status: INITIAL   4.  Left hip flexion >/= 110 degrees so she is able to squat with pain level </= 2/10.  Baseline: Left hip flexion 110 degrees Goal status: ongoing 12/01/22   5.  Patient able to walk for 45 minutes without pain level >/= 2/10 due to left ilium in correct alignment.  Baseline: left ilium is posteriorly rotated, pain level 10/10 Goal status: ongoing 12/01/22     PLAN:   PT FREQUENCY: 1-2x/week   PT DURATION: 12 weeks   PLANNED INTERVENTIONS: Therapeutic exercises, Therapeutic activity, Neuromuscular re-education, Patient/Family education, Joint mobilization, Dry Needling, Electrical stimulation, Spinal mobilization, Cryotherapy, Moist heat, Taping, Ultrasound, Biofeedback, and Manual therapy   PLAN FOR NEXT SESSION:  internal work, see what MD says, hip mobilization in 1/2 kneel, work on hip IR to open up the pelvic floor    Eulis Foster, PT 12/01/22 10:30 AM

## 2022-12-02 ENCOUNTER — Ambulatory Visit
Admission: RE | Admit: 2022-12-02 | Discharge: 2022-12-02 | Disposition: A | Discharge status: Home / Self Care | Payer: BC Managed Care – PPO | Source: Ambulatory Visit | Attending: Nurse Practitioner | Admitting: Nurse Practitioner

## 2022-12-02 DIAGNOSIS — C50012 Malignant neoplasm of nipple and areola, left female breast: Secondary | ICD-10-CM

## 2022-12-07 ENCOUNTER — Encounter: Payer: BC Managed Care – PPO | Admitting: Physical Therapy

## 2022-12-08 ENCOUNTER — Encounter: Payer: BC Managed Care – PPO | Attending: Obstetrics and Gynecology | Admitting: Physical Therapy

## 2022-12-08 ENCOUNTER — Encounter: Payer: Self-pay | Admitting: Physical Therapy

## 2022-12-08 DIAGNOSIS — R102 Pelvic and perineal pain: Secondary | ICD-10-CM | POA: Insufficient documentation

## 2022-12-08 DIAGNOSIS — M62838 Other muscle spasm: Secondary | ICD-10-CM | POA: Insufficient documentation

## 2022-12-08 DIAGNOSIS — M5459 Other low back pain: Secondary | ICD-10-CM | POA: Insufficient documentation

## 2022-12-08 NOTE — Therapy (Signed)
OUTPATIENT PHYSICAL THERAPY TREATMENT NOTE   Patient Name: Beth Hansen MRN: [id number] DOB:December 18, 1985, 37 y.o., female Today's Date: 12/08/2022  PCP: [facility]  REFERRING PROVIDER: Lennart Pall, MD   END OF SESSION:   PT End of Session - 12/08/22 1504     Visit Number 8    Date for PT Re-Evaluation 01/06/23    Authorization Time Period 10/14/2022-01/12/2023    Authorization - Visit Number 8    Authorization - Number of Visits 13    PT Start Time 1500    PT Stop Time 1545    PT Time Calculation (min) 45 min    Activity Tolerance Patient tolerated treatment well    Behavior During Therapy [facility] for tasks assessed/performed             Past Medical History:  Diagnosis Date   Anemia    Past Surgical History:  Procedure Laterality Date   BREAST BIOPSY Left    IUD REMOVAL N/A 12/06/2013   Procedure: INTRAUTERINE DEVICE (IUD) REMOVAL;  Surgeon: Lazaro Arms, MD;  Location: [facility];  Service: Gynecology;  Laterality: N/A;   LAPAROSCOPIC TUBAL LIGATION Bilateral 12/06/2013   Procedure: LAPAROSCOPIC BILATERAL TUBAL CAUTERIZATION USING ELECTROCAUTERY;  Surgeon: Lazaro Arms, MD;  Location: [facility];  Service: Gynecology;  Laterality: Bilateral;   LAPAROSCOPIC UNILATERAL SALPINGECTOMY Left 09/11/2022   Procedure: LAPAROSCOPIC LEFT SALPINGECTOMY WITH REMOVAL OF ECTOPIC PREGNANCY;  Surgeon: Lennart Pall, MD;  Location: [facility] OR;  Service: Gynecology;  Laterality: Left;   tubal reversal  03/03/2020   WISDOM TOOTH EXTRACTION     There are no problems to display for this patient. REFERRING DIAG: M62.89 (ICD-10-CM) - Pelvic floor dysfunction   THERAPY DIAG:  Other low back pain - Plan: PT plan of care cert/re-cert   Pelvic pain - Plan: PT plan of care cert/re-cert   Other muscle spasm - Plan: PT plan of care cert/re-cert   Rationale for Evaluation and Treatment: Rehabilitation   ONSET DATE: 09/11/22   SUBJECTIVE:                                                                                                                                                                                             SUBJECTIVE STATEMENT: My back is feeling better. I have tightness by the pubic bone. I have not seen my doctor yet due to her having an emergency and cancelled appt. I started walking program on Sunday and walked 3.5 miles.     PAIN:  Are you having pain? Yes NPRS scale: 0/10 back, 7/10 pubic bone area Pain location: pubic  area   Pain type: aching, sharp, and throbbing Pain description: constant    Aggravating factors: movement, walking Relieving factors: heating pad   PRECAUTIONS: None   WEIGHT BEARING RESTRICTIONS: No   FALLS:  Has patient fallen in last 6 months? 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    BOWEL MOVEMENT: Pain with bowel movement: No     URINATION: Pain with urination: No Fully empty bladder: Yes:  Stream: Strong Urgency: No Frequency: average Leakage: none   INTERCOURSE:has not been active since surgery   PREGNANCY: Vaginal deliveries 2 Tearing No       OBJECTIVE:    DIAGNOSTIC FINDINGS:  none   PATIENT SURVEYS:  POPIQ-7 25   COGNITION: Overall cognitive status: Within functional limits for tasks assessed                          SENSATION: Light touch: Appears intact Proprioception: Appears intact     LUMBAR SPECIAL TESTS:  SI Compression/distraction test: Positive, on the left      POSTURE: No Significant postural limitations   PELVIC ALIGNMENT: left ilium rotated posteriorly  11/03/22 left ilium rotated post.  12/01/22 pelvis in correct alignment 12/08/22 pelvis in correct alignment    LUMBARAROM/PROM:   A/PROM A/PROM  eval 10/22/22 11/26/22  Flexion Limited at end range   Limited at end range  Extension Decreased by 75% deviating to the right Decreased by 50% without deviation Decreased by 50% without deviation  Right lateral flexion Decreased by 25%   Decreased by 25%  Left lateral flexion full   full  Right rotation Decreased by 25%   full  Left rotation Decreased by 25%   Decreased by 25%   (Blank rows = not tested)   LOWER EXTREMITY ROM:   Passive ROM Right eval Left eval Left  10/20/22 Left 11/26/22  Hip flexion   90 110 110  Hip internal rotation   5   25  Hip external rotation   40   70   (Blank rows = not tested)   LOWER EXTREMITY MMT: Bilateral  hip strength 4/5     PALPATION:   General  tenderness located in the left abdominal region. Tenderness on the pubic bone                 External Perineal Exam tenderness located in the left levator ani, obturator internist                             Internal Pelvic Floor not assessed at this time   Patient confirms identification and approves PT to assess internal pelvic floor and treatment No, will do in the future. Too much pain today.    PELVIC MMT:   MMT eval 11/03/22 11/26/22  Vaginal 2/5  1/5 2/5                TODAY'S TREATMENT: 12/08/22 Manual: Soft tissue mobilization: To assess for dry needling Manual work to bilateral hip adductors, quadriceps, and hamstring to elongate and reduce trigger points Trigger Point Dry-Needling  Treatment instructions: Expect mild to moderate muscle soreness. S/S of pneumothorax if dry  needled over a lung field, and to seek immediate medical attention should they occur. Patient verbalized understanding of these instructions and education.  Patient Consent Given: Yes Education handout provided: Previously provided Muscles treated: bilateral hip adductors Electrical stimulation performed: No Parameters: N/A Treatment response/outcome: elongation of muscle  and trigger point response  Exercises: Stretches/mobility: Happy baby in sitting holding 1 min Sitting hip flexor stretch holding 30 sec bil.  One foot on step and reach for the foot on the step 4 x each side Standing hamstring stretch holding 30 sec bil.  Frog leg stretch rock back and forth     12/01/22 Manual: Spinal mobilization: Mobilized L2 in sidely Internal pelvic floor techniques: No emotional/communication barriers or cognitive limitation. Patient is motivated to learn. Patient understands and agrees with treatment goals and plan. PT explains patient will be examined in standing, sitting, and lying down to see how their muscles and joints work. When they are ready, they will be asked to remove their underwear so PT can examine their perineum. The patient is also given the option of providing their own chaperone as one is not provided in our facility. The patient also has the right and is explained the right to defer or refuse any part of the evaluation or treatment including the internal exam. With the patient's consent, PT will use one gloved finger to gently assess the muscles of the pelvic floor, seeing how well it contracts and relaxes and if there is muscle symmetry. After, the patient will get dressed and PT and patient will discuss exam findings and plan of care. PT and patient discuss plan of care, schedule, attendance policy and HEP activities.  Going through the vagina working on the obturator internist with contract relax and hip movements Going through the vagina and releasing around the bladder and fallopian tube areas Neuromuscular re-education: Pelvic floor contraction training: Worked on pelvic floor contraction but increased soreness in the pelvic floor muscles Down training: Diaphragmatic breathing to lengthen the pelvic floor with therapist finger in the vaginal canal to feel the relaxation of the pelvic floor 1/2 kneel moving forward and back with therapist pulling  the hip backwards and laterally to lengthen the pelvic floor    11/26/22 Manual: Spinal mobilization: PA and rotational mobilization of T5-L5 grade 3  Downward glide of the right SI joint grade 3  Internal pelvic floor techniques: No emotional/communication barriers or cognitive limitation. Patient is motivated to learn. Patient understands and agrees with treatment goals and plan. PT explains patient will be examined in standing, sitting, and lying down to see how their muscles and joints work. When they are ready, they will be asked to remove their underwear so PT can examine their perineum. The patient is also given the option of providing their own chaperone as one is not provided in our facility. The patient also has the right and is explained the right to defer or refuse any part of the evaluation or treatment including the internal exam. With the patient's consent, PT will use one gloved finger to gently assess the muscles of the pelvic floor, seeing how well it contracts and relaxes and if there is muscle symmetry. After, the patient will get dressed and PT and patient will discuss exam findings and plan of care. PT and patient discuss plan of care, schedule, attendance policy and HEP activities.  Working on the [employer], [place]'s canal, obturator internist, ischiocavernosus, bulbocavernosus, sides of the bladder, working around the left fallopian tube with one finger  internally through the vaginal canal and other along the lower abdomen to get fascial releases Neuromuscular re-education: Pelvic floor contraction training: Worked on pelvic floor contraction but at time would cause a cramp in the muscles due to them being so tight                                                                                              PATIENT EDUCATION:  10/28/22 Education details: Access Code: [email or web address], educated patient on how to perform perineal massage.  Person educated: Patient Education method:  Explanation, Demonstration, Tactile cues, Verbal cues, and Handouts Education comprehension: verbalized understanding, returned demonstration, verbal cues required, tactile cues required, and needs further education   HOME EXERCISE PROGRAM: 10/20/19 Access Code: [email or web address] URL: [email or web address] Date: 10/20/2022 Prepared by: Eulis Foster   Program Notes sit with yoga block and gently move knees forward and backward 1x 2 times per day   Exercises - Child's Pose Stretch  - 1 x daily - 7 x weekly - 3 sets - 10 reps - Cat Cow  - 1 x daily - 7 x weekly - 3 sets - 10 reps - Seated Hamstring Stretch  - 1 x daily - 7 x weekly - 3 sets - 2 reps - 30 sec hold - Seated Hip Adductor Stretch  - 1 x daily - 7 x weekly - 1 sets - 2 reps - 30 se3c hold - Seated Piriformis Stretch with Trunk Bend  - 1 x daily - 7 x weekly - 1 sets - 2 reps - 30 sec hold     ASSESSMENT:   CLINICAL IMPRESSION: Patient is a 37 y.o. female who was seen today for physical therapy evaluation and treatment for pelvic floor dysfunction.  Patient is not having back pain. She has many trigger points in the quadriceps and hip adductors. Her pelvis in correct alignment. She is able to move around the day with greater ease. She has started a walking program for 3 miles. Patient will benefit from skilled therapy to improve her function  and reduce her pain to return to exercise and daily tasks.    OBJECTIVE IMPAIRMENTS: decreased activity tolerance, decreased coordination, decreased endurance, decreased mobility, decreased ROM, decreased strength, increased fascial restrictions, increased muscle spasms, and pain.    ACTIVITY LIMITATIONS: carrying, lifting, bending, sitting, standing, squatting, sleeping, transfers, bed mobility, reach over head, and locomotion level   PARTICIPATION LIMITATIONS: meal prep, cleaning, laundry, interpersonal relationship, driving, shopping, community activity, and occupation   PERSONAL  FACTORS: Past/current experiences, Time since onset of injury/illness/exacerbation, and 1 comorbidity: s/p diagnostic laparoscopy and left salpingectomy for ruptured ectopic pregnancy are also affecting patient's functional outcome.    REHAB POTENTIAL: Excellent   CLINICAL DECISION MAKING: Evolving/moderate complexity   EVALUATION COMPLEXITY: Moderate     GOALS: Goals reviewed with patient? Yes   SHORT TERM GOALS: Target date: 11/09/22   Assess internal pelvic floor muscles for trigger points and strength.  Baseline:not done yet Goal status: Met 10/28/23   2.  Independent with initial HEP for stretches and diaphragmatic breathing Baseline: not educated yet Goal status: Met  10/22/22  LONG TERM GOALS: Target date: 01/06/23   Independent with advanced HEP for core, hip and pelvic floor strength.  Baseline: Not educated yet Goal status: INITIAL   2.  Full lumbar ROM so patient is able to lift 10# with correct body mechanics and pain level </= 2/10.  Baseline: reduction in lumbar ROM and pain level 10/10 Goal status: INITIAL   3.  POPIQ-7 is </= 10 due to reduction in pain and reduction in frustration of pain limiting her in daily activities.  Baseline: POPIQ-7 76 Goal status: INITIAL   4.  Left hip flexion >/= 110 degrees so she is able to squat with pain level </= 2/10.  Baseline: Left hip flexion 110 degrees Goal status: ongoing 12/01/22   5.  Patient able to walk for 45 minutes without pain level >/= 2/10 due to left ilium in correct alignment.  Baseline: left ilium is posteriorly rotated, pain level 10/10 Goal status: ongoing 12/01/22     PLAN:   PT FREQUENCY: 1-2x/week   PT DURATION: 12 weeks   PLANNED INTERVENTIONS: Therapeutic exercises, Therapeutic activity, Neuromuscular re-education, Patient/Family education, Joint mobilization, Dry Needling, Electrical stimulation, Spinal mobilization, Cryotherapy, Moist heat, Taping, Ultrasound, Biofeedback, and Manual  therapy   PLAN FOR NEXT SESSION:  internal work, see what MD says, strengthening for core and SI joint    Eulis Foster, PT 12/08/22 3:53 PM

## 2022-12-09 ENCOUNTER — Encounter: Payer: BC Managed Care – PPO | Admitting: Physical Therapy

## 2022-12-10 ENCOUNTER — Encounter: Payer: BC Managed Care – PPO | Admitting: Physical Therapy

## 2022-12-14 ENCOUNTER — Encounter: Payer: BC Managed Care – PPO | Admitting: Physical Therapy

## 2022-12-15 ENCOUNTER — Encounter: Payer: BC Managed Care – PPO | Admitting: Physical Therapy

## 2022-12-15 ENCOUNTER — Encounter: Payer: Self-pay | Admitting: Physical Therapy

## 2022-12-15 DIAGNOSIS — M5459 Other low back pain: Secondary | ICD-10-CM | POA: Diagnosis not present

## 2022-12-15 DIAGNOSIS — M62838 Other muscle spasm: Secondary | ICD-10-CM

## 2022-12-15 DIAGNOSIS — R102 Pelvic and perineal pain: Secondary | ICD-10-CM

## 2022-12-15 NOTE — Therapy (Addendum)
 OUTPATIENT PHYSICAL THERAPY TREATMENT NOTE   Patient Name: Beth Hansen MRN: [id number] DOB:Jun 07, 1986, 37 y.o., female Today's Date: 12/15/2022  PCP: [facility]  REFERRING PROVIDER: Erik Kieth BROCKS, MD   END OF SESSION:   PT End of Session - 12/15/22 1505     Visit Number 9    Date for PT Re-Evaluation 01/06/23    Authorization Type [employer]    Authorization Time Period 10/14/2022-01/12/2023    Authorization - Visit Number 9    Authorization - Number of Visits 13    PT Start Time 1500    PT Stop Time 1545    PT Time Calculation (min) 45 min    Activity Tolerance Patient tolerated treatment well    Behavior During Therapy WFL for tasks assessed/performed             Past Medical History:  Diagnosis Date   Anemia    Past Surgical History:  Procedure Laterality Date   BREAST BIOPSY Left    IUD REMOVAL N/A 12/06/2013   Procedure: INTRAUTERINE DEVICE (IUD) REMOVAL;  Surgeon: Vonn VEAR Inch, MD;  Location: [facility];  Service: Gynecology;  Laterality: N/A;   LAPAROSCOPIC TUBAL LIGATION Bilateral 12/06/2013   Procedure: LAPAROSCOPIC BILATERAL TUBAL CAUTERIZATION USING ELECTROCAUTERY;  Surgeon: Vonn VEAR Inch, MD;  Location: [facility];  Service: Gynecology;  Laterality: Bilateral;   LAPAROSCOPIC UNILATERAL SALPINGECTOMY Left 09/11/2022   Procedure: LAPAROSCOPIC LEFT SALPINGECTOMY WITH REMOVAL OF ECTOPIC PREGNANCY;  Surgeon: Erik Kieth BROCKS, MD;  Location: [facility];  Service: Gynecology;  Laterality: Left;   tubal reversal  03/03/2020   WISDOM TOOTH EXTRACTION     There are no problems to display for this patient. REFERRING DIAG: M62.89 (ICD-10-CM) - Pelvic floor dysfunction   THERAPY DIAG:  Other low back pain - Plan: PT plan of care cert/re-cert   Pelvic pain - Plan: PT plan of care cert/re-cert   Other muscle spasm - Plan: PT plan of care cert/re-cert   Rationale for Evaluation and Treatment: Rehabilitation   ONSET DATE: 09/11/22    SUBJECTIVE:                                                                                                                                                                                            SUBJECTIVE STATEMENT: My back is feeling better. I have tightness by the pubic bone. I have not seen my doctor yet due to her having an emergency and cancelled appt. I started walking program on Sunday and walked 3.5 miles.     PAIN:  Are you having pain? Yes NPRS scale: 0/10 back,  0/10 pubic bone area Pain location: pubic area   Pain type: aching, sharp, and throbbing Pain description: constant    Aggravating factors: movement, walking Relieving factors: heating pad   PRECAUTIONS: None   WEIGHT BEARING RESTRICTIONS: No   FALLS:  Has patient fallen in last 6 months? 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    BOWEL MOVEMENT: Pain with bowel movement: No     URINATION: Pain with urination: No Fully empty bladder: Yes:  Stream: Strong Urgency: No Frequency: average Leakage: none   INTERCOURSE:has not been active since surgery   PREGNANCY: Vaginal deliveries 2 Tearing No       OBJECTIVE:    DIAGNOSTIC FINDINGS:  none   PATIENT SURVEYS:  POPIQ-7 50   COGNITION: Overall cognitive status: Within functional limits for tasks assessed                          SENSATION: Light touch: Appears intact Proprioception: Appears intact     LUMBAR SPECIAL TESTS:  SI Compression/distraction test: Positive, on the left      POSTURE: No Significant postural limitations   PELVIC ALIGNMENT: left ilium rotated posteriorly  11/03/22 left ilium rotated post.  12/01/22 pelvis in correct alignment 12/08/22 pelvis  in correct alignment   LUMBARAROM/PROM:   A/PROM A/PROM  eval 10/22/22 11/26/22  Flexion Limited at end range   Limited at end range  Extension Decreased by 75% deviating to the right Decreased by 50% without deviation Decreased by 50% without deviation  Right lateral flexion Decreased by 25%   Decreased by 25%  Left lateral flexion full   full  Right rotation Decreased by 25%   full  Left rotation Decreased by 25%   Decreased by 25%   (Blank rows = not tested)   LOWER EXTREMITY ROM:   Passive ROM Right eval Left eval Left  10/20/22 Left 11/26/22  Hip flexion   90 110 110  Hip internal rotation   5   25  Hip external rotation   40   70   (Blank rows = not tested)   LOWER EXTREMITY MMT: Bilateral  hip strength 4/5     PALPATION:   General  tenderness located in the left abdominal region. Tenderness on the pubic bone                 External Perineal Exam tenderness located in the left levator ani, obturator internist                             Internal Pelvic Floor not assessed at this time   Patient confirms identification and approves PT to assess internal pelvic floor and treatment No, will do in the future. Too much pain today.    PELVIC MMT:   MMT eval 11/03/22 11/26/22  Vaginal 2/5  1/5 2/5                TODAY'S TREATMENT: 12/15/22 Manual: Soft tissue mobilization: To assess for dry needling Manual work to bilateral quadriceps to elongate after dry needling Trigger Point Dry-Needling  Treatment instructions: Expect mild to moderate muscle soreness. S/S of pneumothorax  if dry needled over a lung field, and to seek immediate medical attention should they occur. Patient verbalized understanding of these instructions and education.  Patient Consent Given: Yes Education handout provided: Previously provided Muscles treated: bilateral quadriceps Electrical stimulation performed: No Parameters: N/A Treatment response/outcome: elongation of muscle and trigger  point response  Exercises: Stretches/mobility: Karolynn pose 2 times holding for 30 sec Strengthening: Bridges 15 x Quadruped lift one leg with verbal cues to engage the lower abdominals 10 x each leg Supine marching with abdominals engaged 20 x each leg   12/08/22 Manual: Soft tissue mobilization: To assess for dry needling Manual work to bilateral hip adductors, quadriceps, and hamstring to elongate and reduce trigger points Trigger Point Dry-Needling  Treatment instructions: Expect mild to moderate muscle soreness. S/S of pneumothorax if dry needled over a lung field, and to seek immediate medical attention should they occur. Patient verbalized understanding of these instructions and education.  Patient Consent Given: Yes Education handout provided: Previously provided Muscles treated: bilateral hip adductors Electrical stimulation performed: No Parameters: N/A Treatment response/outcome: elongation of muscle and trigger point response  Exercises: Stretches/mobility: Happy baby in sitting holding 1 min Sitting hip flexor stretch holding 30 sec bil.  One foot on step and reach for the foot on the step 4 x each side Standing hamstring stretch holding 30 sec bil.  Frog leg stretch rock back and forth     12/01/22 Manual: Spinal mobilization: Mobilized L2 in sidely Internal pelvic floor techniques: No emotional/communication barriers or cognitive limitation. Patient is motivated to learn. Patient understands and agrees with treatment goals and plan. PT explains patient will be examined in standing, sitting, and lying down to see how their muscles and joints work. When they are ready, they will be asked to remove their underwear so PT can examine their perineum. The patient is also given the option of providing their own chaperone as one is not provided in our facility. The patient also has the right and is explained the right to defer or refuse any part of the evaluation or treatment  including the internal exam. With the patient's consent, PT will use one gloved finger to gently assess the muscles of the pelvic floor, seeing how well it contracts and relaxes and if there is muscle symmetry. After, the patient will get dressed and PT and patient will discuss exam findings and plan of care. PT and patient discuss plan of care, schedule, attendance policy and HEP activities.  Going through the vagina working on the obturator internist with contract relax and hip movements Going through the vagina and releasing around the bladder and fallopian tube areas Neuromuscular re-education: Pelvic floor contraction training: Worked on pelvic floor contraction but increased soreness in the pelvic floor muscles Down training: Diaphragmatic breathing to lengthen the pelvic floor with therapist finger in the vaginal canal to feel the relaxation of the pelvic floor 1/2 kneel moving forward and back with therapist pulling the hip backwards and laterally to lengthen the pelvic floor      PATIENT EDUCATION:  12/15/22 Education details: Access Code: [email or web address], educated patient on how to perform perineal massage.  Person educated: Patient Education method: Explanation, Demonstration, Tactile cues, Verbal cues, and Handouts Education comprehension: verbalized understanding, returned demonstration, verbal cues required, tactile cues required, and needs further education   HOME EXERCISE PROGRAM: 12/15/22 Access Code: [email or web address] URL: [email or web address] Date: 12/15/2022 Prepared by: Channing Pereyra  Program Notes sit with yoga block and gently move knees forward and backward 1x 2  times per day  Exercises - - Supine March  - 1 x daily - 3 x weekly - 2 sets - 10 reps - Beginner Front Arm Support  - 1 x daily - 3 x weekly - 2 sets - 10 reps - Supine Bridge  - 1 x daily - 3 x weekly - 2 sets - 10 reps    ASSESSMENT:   CLINICAL IMPRESSION: Patient is a 37 y.o. female who was seen  today for physical therapy evaluation and treatment for pelvic floor dysfunction. Patient came in without pain for the first time. She continues to have tightness in the quadriceps.  The manual work and dry needling reduced her menstrual cramps. Patient will benefit from skilled therapy to improve her function  and reduce her pain to return to exercise and daily tasks.    OBJECTIVE IMPAIRMENTS: decreased activity tolerance, decreased coordination, decreased endurance, decreased mobility, decreased ROM, decreased strength, increased fascial restrictions, increased muscle spasms, and pain.    ACTIVITY LIMITATIONS: carrying, lifting, bending, sitting, standing, squatting, sleeping, transfers, bed mobility, reach over head, and locomotion level   PARTICIPATION LIMITATIONS: meal prep, cleaning, laundry, interpersonal relationship, driving, shopping, community activity, and occupation   PERSONAL FACTORS: Past/current experiences, Time since onset of injury/illness/exacerbation, and 1 comorbidity: s/p diagnostic laparoscopy and left salpingectomy for ruptured ectopic pregnancy are also affecting patient's functional outcome.    REHAB POTENTIAL: Excellent   CLINICAL DECISION MAKING: Evolving/moderate complexity   EVALUATION COMPLEXITY: Moderate     GOALS: Goals reviewed with patient? Yes   SHORT TERM GOALS: Target date: 11/09/22   Assess internal pelvic floor muscles for trigger points and strength.  Baseline:not done yet Goal status: Met 10/28/23   2.  Independent with initial HEP for stretches and diaphragmatic breathing Baseline: not educated yet Goal status: Met  10/22/22     LONG TERM GOALS: Target date: 01/06/23   Independent with advanced HEP for core, hip and pelvic floor strength.  Baseline: Not educated yet Goal status: ongoing 12/15/22   2.  Full lumbar ROM so patient is able to lift 10# with correct body mechanics and pain level </= 2/10.  Baseline: reduction in lumbar ROM and pain  level 10/10 Goal status: INITIAL   3.  POPIQ-7 is </= 10 due to reduction in pain and reduction in frustration of pain limiting her in daily activities.  Baseline: POPIQ-7 76 Goal status: INITIAL   4.  Left hip flexion >/= 110 degrees so she is able to squat with pain level </= 2/10.  Baseline: Left hip flexion 110 degrees Goal status: Met 12/15/22   5.  Patient able to walk for 45 minutes without pain level >/= 2/10 due to left ilium in correct alignment.  Baseline: left ilium is posteriorly rotated, pain level 10/10 Goal status: ongoing 12/01/22     PLAN:   PT FREQUENCY: 1-2x/week   PT DURATION: 12 weeks   PLANNED INTERVENTIONS: Therapeutic exercises, Therapeutic activity, Neuromuscular re-education, Patient/Family education, Joint mobilization, Dry Needling, Electrical stimulation, Spinal mobilization, Cryotherapy, Moist heat, Taping, Ultrasound, Biofeedback, and Manual therapy   PLAN FOR NEXT SESSION:  internal work, strengthening for core and SI joint; ask about walking 45 minutes, work on lifting    Channing Pereyra, PT 12/15/22 4:00 PM  PHYSICAL THERAPY DISCHARGE SUMMARY  Visits from Start of Care: 9  Current functional level related to goals / functional outcomes: See above. Patient did not return after her last visit on 12/15/22.    Remaining deficits: See above.  Education / Equipment: HEP   Patient agrees to discharge. Patient goals were not met. Patient is being discharged due to not returning since the last visit. Thank you for the referral.   Channing Pereyra, PT 06/06/24 3:12 PM

## 2022-12-16 ENCOUNTER — Encounter: Payer: BC Managed Care – PPO | Admitting: Physical Therapy

## 2022-12-17 ENCOUNTER — Encounter: Payer: BC Managed Care – PPO | Admitting: Physical Therapy

## 2022-12-21 ENCOUNTER — Encounter: Payer: BC Managed Care – PPO | Admitting: Physical Therapy

## 2022-12-22 ENCOUNTER — Encounter: Payer: BC Managed Care – PPO | Admitting: Physical Therapy

## 2022-12-23 ENCOUNTER — Encounter: Payer: BC Managed Care – PPO | Admitting: Physical Therapy

## 2022-12-24 ENCOUNTER — Encounter: Payer: BC Managed Care – PPO | Admitting: Physical Therapy

## 2022-12-28 ENCOUNTER — Encounter: Payer: BC Managed Care – PPO | Admitting: Physical Therapy

## 2022-12-29 ENCOUNTER — Encounter: Payer: BC Managed Care – PPO | Admitting: Physical Therapy

## 2022-12-30 ENCOUNTER — Encounter: Payer: BC Managed Care – PPO | Admitting: Physical Therapy

## 2022-12-31 ENCOUNTER — Encounter: Payer: BC Managed Care – PPO | Admitting: Physical Therapy

## 2023-01-04 ENCOUNTER — Encounter: Payer: BC Managed Care – PPO | Admitting: Physical Therapy

## 2023-01-05 ENCOUNTER — Encounter: Payer: BC Managed Care – PPO | Attending: Obstetrics and Gynecology | Admitting: Physical Therapy

## 2023-01-05 DIAGNOSIS — M5459 Other low back pain: Secondary | ICD-10-CM | POA: Insufficient documentation

## 2023-01-05 DIAGNOSIS — R102 Pelvic and perineal pain: Secondary | ICD-10-CM | POA: Insufficient documentation

## 2023-01-05 DIAGNOSIS — M62838 Other muscle spasm: Secondary | ICD-10-CM | POA: Insufficient documentation

## 2023-01-06 ENCOUNTER — Encounter: Payer: BC Managed Care – PPO | Admitting: Physical Therapy

## 2023-02-18 ENCOUNTER — Other Ambulatory Visit (HOSPITAL_COMMUNITY): Payer: Self-pay

## 2023-02-19 ENCOUNTER — Other Ambulatory Visit (HOSPITAL_COMMUNITY): Payer: Self-pay

## 2023-02-19 MED ORDER — SEMAGLUTIDE-WEIGHT MANAGEMENT 0.25 MG/0.5ML ~~LOC~~ SOAJ
0.2500 mg | SUBCUTANEOUS | 1 refills | Status: DC
  Filled 2023-02-19: qty 2, 28d supply, fill #0

## 2023-03-12 ENCOUNTER — Other Ambulatory Visit (HOSPITAL_COMMUNITY): Payer: Self-pay

## 2023-03-12 MED ORDER — WEGOVY 0.25 MG/0.5ML ~~LOC~~ SOAJ
0.2500 mg | SUBCUTANEOUS | 2 refills | Status: DC
  Filled 2023-03-12 – 2023-07-30 (×4): qty 2, 28d supply, fill #0

## 2023-03-15 ENCOUNTER — Other Ambulatory Visit (HOSPITAL_COMMUNITY): Payer: Self-pay

## 2023-03-16 ENCOUNTER — Other Ambulatory Visit (HOSPITAL_COMMUNITY): Payer: Self-pay

## 2023-03-16 ENCOUNTER — Other Ambulatory Visit: Payer: Self-pay

## 2023-03-16 MED ORDER — WEGOVY 0.5 MG/0.5ML ~~LOC~~ SOAJ
0.5000 mg | SUBCUTANEOUS | 0 refills | Status: DC
  Filled 2023-03-16: qty 2, 28d supply, fill #0

## 2023-03-16 MED ORDER — SEMAGLUTIDE-WEIGHT MANAGEMENT 1 MG/0.5ML ~~LOC~~ SOAJ
1.0000 mg | SUBCUTANEOUS | 0 refills | Status: DC
  Filled 2023-03-16: qty 2, 28d supply, fill #0
  Filled 2023-03-17: qty 2, fill #0
  Filled 2023-04-12 (×3): qty 2, 28d supply, fill #0

## 2023-03-17 ENCOUNTER — Other Ambulatory Visit (HOSPITAL_COMMUNITY): Payer: Self-pay

## 2023-03-29 ENCOUNTER — Other Ambulatory Visit (HOSPITAL_COMMUNITY): Payer: Self-pay

## 2023-03-29 MED ORDER — KETOTIFEN FUMARATE 0.035 % OP SOLN
1.0000 [drp] | Freq: Two times a day (BID) | OPHTHALMIC | 0 refills | Status: DC
  Filled 2023-03-29: qty 5, 50d supply, fill #0
  Filled 2023-07-29: qty 5, 25d supply, fill #0

## 2023-04-08 ENCOUNTER — Other Ambulatory Visit (HOSPITAL_COMMUNITY): Payer: Self-pay

## 2023-04-08 MED ORDER — WEGOVY 0.5 MG/0.5ML ~~LOC~~ SOAJ
0.5000 mg | SUBCUTANEOUS | 0 refills | Status: DC
  Filled 2023-04-08 – 2023-05-21 (×2): qty 2, 28d supply, fill #0

## 2023-04-12 ENCOUNTER — Other Ambulatory Visit: Payer: Self-pay

## 2023-04-12 ENCOUNTER — Other Ambulatory Visit (HOSPITAL_COMMUNITY): Payer: Self-pay

## 2023-05-21 ENCOUNTER — Other Ambulatory Visit (HOSPITAL_COMMUNITY): Payer: Self-pay

## 2023-05-21 MED ORDER — SEMAGLUTIDE-WEIGHT MANAGEMENT 1 MG/0.5ML ~~LOC~~ SOAJ
1.0000 mg | SUBCUTANEOUS | 0 refills | Status: DC
  Filled 2023-05-21: qty 2, 28d supply, fill #0

## 2023-05-27 ENCOUNTER — Other Ambulatory Visit (HOSPITAL_COMMUNITY): Payer: Self-pay

## 2023-07-24 ENCOUNTER — Emergency Department (HOSPITAL_BASED_OUTPATIENT_CLINIC_OR_DEPARTMENT_OTHER): Payer: Medicaid Other

## 2023-07-24 ENCOUNTER — Encounter (HOSPITAL_BASED_OUTPATIENT_CLINIC_OR_DEPARTMENT_OTHER): Payer: Self-pay

## 2023-07-24 ENCOUNTER — Other Ambulatory Visit: Payer: Self-pay

## 2023-07-24 ENCOUNTER — Emergency Department (HOSPITAL_BASED_OUTPATIENT_CLINIC_OR_DEPARTMENT_OTHER)
Admission: EM | Admit: 2023-07-24 | Discharge: 2023-07-24 | Disposition: A | Discharge status: Home / Self Care | Payer: Medicaid Other | Attending: Emergency Medicine | Admitting: Emergency Medicine

## 2023-07-24 DIAGNOSIS — Z91040 Latex allergy status: Secondary | ICD-10-CM | POA: Diagnosis not present

## 2023-07-24 DIAGNOSIS — R112 Nausea with vomiting, unspecified: Secondary | ICD-10-CM | POA: Diagnosis not present

## 2023-07-24 DIAGNOSIS — R1032 Left lower quadrant pain: Secondary | ICD-10-CM

## 2023-07-24 LAB — CBC
HCT: 40.7 % (ref 36.0–46.0)
Hemoglobin: 13.3 g/dL (ref 12.0–15.0)
MCH: 27.5 pg (ref 26.0–34.0)
MCHC: 32.7 g/dL (ref 30.0–36.0)
MCV: 84.1 fL (ref 80.0–100.0)
Platelets: 282 10*3/uL (ref 150–400)
RBC: 4.84 MIL/uL (ref 3.87–5.11)
RDW: 12.8 % (ref 11.5–15.5)
WBC: 4.1 10*3/uL (ref 4.0–10.5)
nRBC: 0 % (ref 0.0–0.2)

## 2023-07-24 LAB — URINALYSIS, ROUTINE W REFLEX MICROSCOPIC
Bilirubin Urine: NEGATIVE
Glucose, UA: NEGATIVE mg/dL
Ketones, ur: NEGATIVE mg/dL
Nitrite: NEGATIVE
Specific Gravity, Urine: 1.026 (ref 1.005–1.030)
pH: 6 (ref 5.0–8.0)

## 2023-07-24 LAB — URINALYSIS, W/ REFLEX TO CULTURE (INFECTION SUSPECTED)
Bacteria, UA: NONE SEEN
Bilirubin Urine: NEGATIVE
Glucose, UA: NEGATIVE mg/dL
Hgb urine dipstick: NEGATIVE
Ketones, ur: NEGATIVE mg/dL
Nitrite: NEGATIVE
Specific Gravity, Urine: >1.046 — ABNORMAL HIGH (ref 1.005–1.030)
pH: 6 (ref 5.0–8.0)

## 2023-07-24 LAB — COMPREHENSIVE METABOLIC PANEL
ALT: 6 U/L (ref 0–44)
AST: 13 U/L — ABNORMAL LOW (ref 15–41)
Albumin: 4 g/dL (ref 3.5–5.0)
Alkaline Phosphatase: 61 U/L (ref 38–126)
Anion gap: 5 (ref 5–15)
BUN: 7 mg/dL (ref 6–20)
CO2: 27 mmol/L (ref 22–32)
Calcium: 9 mg/dL (ref 8.9–10.3)
Chloride: 104 mmol/L (ref 98–111)
Creatinine, Ser: 0.76 mg/dL (ref 0.44–1.00)
GFR, Estimated: >60 mL/min (ref >60)
Glucose, Bld: 85 mg/dL (ref 70–99)
Potassium: 3.8 mmol/L (ref 3.5–5.1)
Sodium: 136 mmol/L (ref 135–145)
Total Bilirubin: 0.6 mg/dL (ref 0.0–1.2)
Total Protein: 8 g/dL (ref 6.5–8.1)

## 2023-07-24 LAB — HCG, SERUM, QUALITATIVE: Preg, Serum: NEGATIVE

## 2023-07-24 LAB — PREGNANCY, URINE: Preg Test, Ur: NEGATIVE

## 2023-07-24 MED ORDER — IOHEXOL 300 MG/ML  SOLN
100.0000 mL | Freq: Once | INTRAMUSCULAR | Status: AC | PRN
  Administered 2023-07-24: 100 mL via INTRAVENOUS

## 2023-07-24 MED ORDER — KETOROLAC TROMETHAMINE 15 MG/ML IJ SOLN
15.0000 mg | Freq: Once | INTRAMUSCULAR | Status: AC
  Administered 2023-07-24: 15 mg via INTRAVENOUS
  Filled 2023-07-24: qty 1

## 2023-07-24 NOTE — Discharge Instructions (Addendum)
You have been seen today for your complaint of left lower quadrant abdominal pain. Your lab work was reassuring. Your imaging was equivocal for blood flow to the left ovary but was otherwise reassuring. Follow up with: Dr. Berton Lan.  Call to schedule follow-up appointment for Monday or Tuesday Please seek immediate medical care if you develop any of the following symptoms: You have sudden, severe pelvic pain. You have nausea and vomiting that does not go away. At this time there does not appear to be the presence of an emergent medical condition, however there is always the potential for conditions to change. Please read and follow the below instructions.  Do not take your medicine if  develop an itchy rash, swelling in your mouth or lips, or difficulty breathing; call 911 and seek immediate emergency medical attention if this occurs.  You may review your lab tests and imaging results in their entirety on your MyChart account.  Please discuss all results of fully with your primary care provider and other specialist at your follow-up visit.  Note: Portions of this text may have been transcribed using voice recognition software. Every effort was made to ensure accuracy; however, inadvertent computerized transcription errors may still be present.

## 2023-07-24 NOTE — ED Notes (Signed)
Patient given discharge instructions. Questions were answered. Patient verbalized understanding of discharge instructions and care at home.  

## 2023-07-24 NOTE — ED Triage Notes (Signed)
Patient arrives ambulatory to the ED with complaints of worsening left side abdominal/back pain. Patient reports her pain being a 10/10. She also states that she has a previous ectopic pregnancy on her right side that she had to have surgery for.

## 2023-07-24 NOTE — ED Provider Notes (Signed)
[place] EMERGENCY DEPARTMENT AT [facility] Provider Note   CSN: [id number] Arrival date & time: 07/24/23  7106     History  Chief Complaint  Patient presents with   Abdominal Pain   Back Pain    Genesia B Ufford is a 38 y.o. female.  With a history of anemia, bilateral tubal ligation with recent reversal presenting to the ED for evaluation of abdominal pain.  Localized to the left lower quadrant with radiation to the left low back and left hip.  Symptoms began 1 week ago and have progressively gotten worse.  Symptoms were initially intermittent but are now constant.  Described as a sharp and stabbing sensation.  Symptoms are improved with laying on her right side.  Worsened with any movements.  She denies any fevers or chills.  Some nausea with 1 episode of emesis today.  No diarrhea or constipation.  She has had some scant blood in her stool lately.  No dysuria, frequency, urgency, hematuria, vaginal bleeding, discharge, pain.  She states she has Nexplanon for birth control.   Abdominal Pain Back Pain Associated symptoms: abdominal pain        Home Medications Prior to Admission medications   Medication Sig Start Date End Date Taking? Authorizing Provider  acetaminophen (TYLENOL) 500 MG tablet Take 2 tablets (1,000 mg total) by mouth every 8 (eight) hours. Patient not taking: Reported on 10/15/2022 09/11/22   Lennart Pall, MD  cyclobenzaprine (FLEXERIL) 10 MG tablet Take 1 tablet (10 mg total) by mouth every 8 (eight) hours as needed for muscle spasms. 10/12/22   Lennart Pall, MD  desogestrel-ethinyl estradiol (MIRCETTE) 0.15-0.02/0.01 MG (21/5) tablet Take 1 tablet by mouth daily. 10/12/22   Lennart Pall, MD  ketotifen (ZADITOR) 0.035 % ophthalmic solution Apply 1 drop to affected eye(s) in the morning and in the evening. 03/29/23     Semaglutide-Weight Management (WEGOVY) 0.25 MG/0.5ML SOAJ Inject 0.25 mg into the skin once a week. 03/12/23      Semaglutide-Weight Management (WEGOVY) 0.5 MG/0.5ML SOAJ Inject 0.5 mg into the skin once a week. 04/08/23     Semaglutide-Weight Management 1 MG/0.5ML SOAJ Inject 1 mg into the skin once a week. 05/21/23         Allergies    Latex    Review of Systems   Review of Systems  Gastrointestinal:  Positive for abdominal pain.  All other systems reviewed and are negative.   Physical Exam Updated Vital Signs BP 107/77   Pulse 88   Temp 98.3 F (36.8 C)   Resp 20   Ht 5\' 8"  (1.727 m)   Wt 98 kg   LMP 05/19/2023   SpO2 100%   BMI 32.85 kg/m  Physical Exam Vitals and nursing note reviewed.  Constitutional:      General: She is not in acute distress.    Appearance: Normal appearance. She is normal weight. She is not ill-appearing.     Comments: Resting comfortably in bed  HENT:     Head: Normocephalic and atraumatic.  Cardiovascular:     Rate and Rhythm: Normal rate.  Pulmonary:     Effort: Pulmonary effort is normal. No respiratory distress.  Abdominal:     General: Abdomen is flat.     Palpations: Abdomen is soft.     Tenderness: There is abdominal tenderness in the left lower quadrant. There is left CVA tenderness and guarding. There is no right CVA tenderness. Negative signs include Murphy's sign and McBurney's  sign.     Comments: No rashes  Musculoskeletal:        General: Normal range of motion.     Cervical back: Neck supple.  Skin:    General: Skin is warm and dry.  Neurological:     Mental Status: She is alert and oriented to person, place, and time.  Psychiatric:        Mood and Affect: Mood normal.        Behavior: Behavior normal.     ED Results / Procedures / Treatments   Labs (all labs ordered are listed, but only abnormal results are displayed) Labs Reviewed  COMPREHENSIVE METABOLIC PANEL - Abnormal; Notable for the following components:      Result Value   AST 13 (*)    All other components within normal limits  URINALYSIS, ROUTINE W REFLEX  MICROSCOPIC - Abnormal; Notable for the following components:   APPearance HAZY (*)    Hgb urine dipstick TRACE (*)    Protein, ur TRACE (*)    Leukocytes,Ua MODERATE (*)    Bacteria, UA RARE (*)    All other components within normal limits  URINALYSIS, W/ REFLEX TO CULTURE (INFECTION SUSPECTED) - Abnormal; Notable for the following components:   Specific Gravity, Urine >1.046 (*)    Protein, ur TRACE (*)    Leukocytes,Ua SMALL (*)    All other components within normal limits  CBC  PREGNANCY, URINE  HCG, SERUM, QUALITATIVE    EKG None  Radiology US PELVIC COMPLETE W TRANSVAGINAL AND TORSION R/O Result Date: 07/24/2023 CLINICAL DATA:  Left lower quadrant pain 1 week previous left salpingectomy. Previous bilateral tubal ligation with reversal. EXAM: TRANSABDOMINAL AND TRANSVAGINAL ULTRASOUND OF PELVIS DOPPLER ULTRASOUND OF OVARIES TECHNIQUE: Both transabdominal and transvaginal ultrasound examinations of the pelvis were performed. Transabdominal technique was performed for global imaging of the pelvis including uterus, ovaries, adnexal regions, and pelvic cul-de-sac. It was necessary to proceed with endovaginal exam following the transabdominal exam to visualize the endometrium and ovaries. Color and duplex Doppler ultrasound was utilized to evaluate blood flow to the ovaries. COMPARISON:  09/14/2022 FINDINGS: Uterus Measurements: 8.2 x 4.9 x 5.9 cm = volume: 122 mL. No fibroids or other mass visualized. Endometrium Thickness: 5 mm.  No focal abnormality visualized. Right ovary Measurements: 3.2 x 2.1 x 2.1 cm = volume: 7 mL. Normal appearance/no adnexal mass. Left ovary Measurements: 2.3 x 1.4 x 2.4 cm = volume: 4.1 mL. Left ovary lies immediately posterior to the uterus. Normal appearance/no adnexal mass. Left ovary somewhat difficult to evaluate due to position deep posterior to the uterus. Pulsed Doppler evaluation of both ovaries demonstrates normal arterial and venous flow the right ovary.  Difficult Doppler evaluation of the left ovary partially due to deep position posterior to the uterus. Suggestion of mild venous flow is detected, although no definite arterial flow. Previous exam from March 2024 also demonstrates similar position of the ovary with no significant color Doppler. Other findings No free fluid. IMPRESSION: 1. Normal uterus and right ovary. 2. Left ovary somewhat difficult to evaluate due to deep position posterior to the uterus. Suggestion of mild venous flow is detected, although no definite arterial flow. There are no secondary signs of left ovarian torsion, although this exam is still equivocal for left ovarian torsion. Recommend clinical correlation and gynecologic consultation. Electronically Signed   By: Elberta Fortis M.D.   On: 07/24/2023 15:34   CT ABDOMEN PELVIS W CONTRAST Result Date: 07/24/2023 CLINICAL DATA:  Left lower  quadrant pain radiating to left flank. EXAM: CT ABDOMEN AND PELVIS WITH CONTRAST TECHNIQUE: Multidetector CT imaging of the abdomen and pelvis was performed using the standard protocol following bolus administration of intravenous contrast. RADIATION DOSE REDUCTION: This exam was performed according to the departmental dose-optimization program which includes automated exposure control, adjustment of the mA and/or kV according to patient size and/or use of iterative reconstruction technique. CONTRAST:  OMNIPAQUE IOHEXOL 300 MG/ML  SOLN COMPARISON:  None Available. FINDINGS: Lower chest: Heart is normal size.  Lung bases are clear. Hepatobiliary: There is a subcentimeter hypodensity over the left lobe of the liver too small to characterize but likely a cyst. Gallbladder and biliary tree are normal. Pancreas: Normal. Spleen: 1 cm cyst over the superior aspect of the spleen. Adrenals/Urinary Tract: Adrenal glands are normal. Kidneys are normal in size without hydronephrosis or nephrolithiasis. Ureters and bladder are normal. Stomach/Bowel: Stomach is  normal. Loop of jejunum over the left upper quadrant at the upper limits of normal in diameter. Remaining small bowel is normal. Appendix not well visualized. Colon is normal. Vascular/Lymphatic: Abdominal aorta is normal in caliber. Remaining vascular structures are unremarkable. No adenopathy. Reproductive: Uterus and bilateral adnexa are unremarkable. Other: Periumbilical hernia in the midline immediately above the umbilicus with fascial defect measuring 1.2 cm. This contains only peritoneal fat. Musculoskeletal: No focal abnormality. IMPRESSION: 1. No acute findings in the abdomen/pelvis. 2. Periumbilical hernia in the midline immediately above the umbilicus with fascial defect measuring 1.2 cm. This contains only peritoneal fat. 3. 1 cm splenic cyst. 4. Subcentimeter hypodensity over the left lobe of the liver too small to characterize, but likely a cyst. Electronically Signed   By: Elberta Fortis M.D.   On: 07/24/2023 13:55    Procedures Procedures    Medications Ordered in ED Medications  iohexol (OMNIPAQUE) 300 MG/ML solution 100 mL (100 mLs Intravenous Contrast Given 07/24/23 1323)  ketorolac (TORADOL) 15 MG/ML injection 15 mg (15 mg Intravenous Given 07/24/23 1617)    ED Course/ Medical Decision Making/ A&P Clinical Course as of 07/24/23 1732  Sat Jul 24, 2023  1552 Discussed case with gynecology Dr. Debroah Loop.  He recommends pain control, repeat urine, [facility] in the next 2 days for reassessment.  Less likely an ovarian torsion given duration of symptoms, lack of secondary signs [AS]    Clinical Course User Index [AS] Maveric Debono, Edsel Petrin, PA-C                                 Medical Decision Making Amount and/or Complexity of Data Reviewed Labs: ordered. Radiology: ordered.  Risk Prescription drug management.  This patient presents to the ED for concern of left lower quadrant abdominal pain, this involves an extensive number of treatment  options, and is a complaint that carries with it a high risk of complications and morbidity.  Differential diagnosis of her lower abdominal considerations include pelvic inflammatory disease, ectopic pregnancy, appendicitis, urinary calculi, primary dysmenorrhea, septic abortion, ruptured ovarian cyst or tumor, ovarian torsion, tubo-ovarian abscess, degeneration of fibroid, endometriosis, diverticulitis, cystitis.   My initial workup includes labs, imaging.  Patient declines pain and nausea medication  Additional history obtained from: Nursing notes from this visit.  I ordered, reviewed and interpreted labs which include: CBC, CMP, urinalysis, hCG.  No leukocytosis or anemia.  No significant electrolyte derangement, kidney dysfunction or LFT abnormality.  Urine specimen appears contaminated with  21-50 epithelial cells, does have moderate leukocytes 6-10 white blood cells and rare bacteria  I ordered imaging studies including CT abdomen pelvis, pelvic ultrasound I independently visualized and interpreted imaging which showed no acute findings in the abdomen or pelvis on CT, equivocal ultrasound for torsion of the left ovary although no secondary signs I agree with the radiologist interpretation  Consultations Obtained:  I requested consultation with gynecology Dr. Debroah Loop,  and discussed lab and imaging findings as well as pertinent plan - they recommend: Stated in ED course  Afebrile, hemodynamically stable.  38 year old female presenting to the ED for evaluation of left lower quadrant and left lower back abdominal pain.  Symptoms began 1 week ago and have progressively worsened.  Does have a history of ectopic pregnancy on the left.  Lab workup was reassuring.  CT abdomen pelvis reassuring.  Pelvic ultrasound equivocal for left ovarian torsion.  Discussed case with gynecology with recommendations stated in ED course.  Discussed plan with the patient.  She prefers pain control and outpatient  follow-up.  Overall, given her 7 days of symptoms and lack of secondary findings on imaging believe torsion is less likely.  She was strongly encouraged to return to the emergency department immediately with any new or worsening symptoms.  She was given [facility] for [facility] and encouraged to follow-up on Monday for reassessment.  Stable at discharge.  At this time there does not appear to be any evidence of an acute emergency medical condition and the patient appears stable for discharge with appropriate outpatient follow up. Diagnosis was discussed with patient who verbalizes understanding of care plan and is agreeable to discharge. I have discussed return precautions with patient who verbalizes understanding. Patient encouraged to follow-up with their PCP within 1 week. All questions answered.  Note: Portions of this report may have been transcribed using voice recognition software. Every effort was made to ensure accuracy; however, inadvertent computerized transcription errors may still be present.         Final Clinical Impression(s) / ED Diagnoses Final diagnoses:  LLQ abdominal pain    Rx / DC Orders ED Discharge Orders     None         Michelle Piper, Cordelia Poche 07/24/23 1732    Margarita Grizzle, MD 07/25/23 1630

## 2023-07-29 ENCOUNTER — Encounter: Payer: Self-pay | Admitting: Obstetrics and Gynecology

## 2023-07-29 ENCOUNTER — Other Ambulatory Visit (HOSPITAL_COMMUNITY)
Admission: RE | Admit: 2023-07-29 | Discharge: 2023-07-29 | Disposition: A | Discharge status: Home / Self Care | Payer: Medicaid Other | Source: Ambulatory Visit | Attending: Obstetrics and Gynecology | Admitting: Obstetrics and Gynecology

## 2023-07-29 ENCOUNTER — Ambulatory Visit: Payer: Medicaid Other | Admitting: Obstetrics and Gynecology

## 2023-07-29 ENCOUNTER — Other Ambulatory Visit (HOSPITAL_COMMUNITY): Payer: Self-pay

## 2023-07-29 VITALS — BP 119/88 | HR 85 | Wt 207.3 lb

## 2023-07-29 DIAGNOSIS — N898 Other specified noninflammatory disorders of vagina: Secondary | ICD-10-CM | POA: Insufficient documentation

## 2023-07-29 DIAGNOSIS — M7918 Myalgia, other site: Secondary | ICD-10-CM

## 2023-07-29 MED ORDER — CYCLOBENZAPRINE HCL 10 MG PO TABS
10.0000 mg | ORAL_TABLET | Freq: Three times a day (TID) | ORAL | 0 refills | Status: AC | PRN
  Filled 2023-07-29: qty 30, 10d supply, fill #0

## 2023-07-29 NOTE — Progress Notes (Signed)
   RETURN GYNECOLOGY VISIT  Subjective:  Beth Hansen is a 38 y.o. [id number] with w/ Nexplanon in place (10/15/22) presenting for ED follow up of LLQ pain.   Symptoms were present x 1 week and progressively worsened until she had constant sharp, stabbing pain. Improved w/ lying on right side. Associated with nausea and 1 episode of emesis. Work up unremarkable, they could not definitively see flow to the left ovary. GYN was consulted for possible torsion, deemed unlikely and patient was discharged home.   Pt is just shy of 1 year out from diagnostic laparoscopy & removal of ectopic pregnancy 09/2022. She has history of BTL w/ reversal and had significant adhesive disease intraoperatively. Photos are available for review. Her US findings (re: position of L ovary) are consistent with intra-op findings.   TODAY pt reports ongoing pain that is alleviated with ibuprofen 800mg  q6-8h and extra strength tylenol around the clock. Radiates around her back and down her left hip/leg. Has been present for 2 weeks. No constipation, f/c/n/v. No trigger other than stopping wegovy around a week ago. Notes that some of this pain is similar to what she worked through with PFPT.   I personally reviewed: - ED provider note 07/24/23 - CBC 07/24/23 4.1>13.3<282 - CMP 07/24/23 unremarkable - hCG 07/24/23 negative - UA 07/24/23 trace Hgb, trace protein, moderate LE, negative nitrite, negative ketones - CT A/P 07/24/23 no acute findings - Pelvic US 07/24/23 left ovary immediately posterior to uterus without adenxal mass, difficult to do dopplers on L ovary due to position so no definite arterial flow was seen  Objective:   Vitals:   07/29/23 1340  BP: 119/88  Pulse: 85  Weight: 207 lb 4.8 oz (94 kg)   General:  Alert, oriented and cooperative. Patient is in no acute distress.  Skin: Skin is warm and dry. No rash noted.   Cardiovascular: Normal heart rate noted  Respiratory: Normal respiratory effort, no problems with  respiration noted  Abdomen: Soft, non-tender, non-distended   Pelvic: NEFG. Pain entirely reproducible with palpation of left obturator, left levators, abdominal & hip musculature. No adnexal or uterine tenderness  Exam performed in the presence of a chaperone  Assessment and Plan:  Beth Hansen is a 39 y.o. 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 Low suspicion for torsion as etiology of her pain.  - GC/CT collected to r/o infectious etiology but exam is not c/w PID today -     Ambulatory referral to Physical Therapy -     cyclobenzaprine (FLEXERIL) 10 MG tablet; Take 1 tablet (10 mg total) by mouth every 8 (eight) hours as needed for muscle spasms.  Vaginal discharge -     Cervicovaginal ancillary only( [facility])  Return in about 1 year (around 07/28/2024) for annual exam or sooner as needed.   Lennart Pall, MD

## 2023-07-29 NOTE — Progress Notes (Signed)
Pt presents for ED follow up. Pt had ultrasound in hospital. Pt reports pain in LLQ still ongoing. Takes 800mg  ibuprofen 4 times a day that seems to help. Pain is sharp, shooting. Pt did recently have bleeding with BM. Pt complaints of watery discharge. No cycle since 05/19/23.

## 2023-07-30 ENCOUNTER — Other Ambulatory Visit (HOSPITAL_COMMUNITY): Payer: Self-pay

## 2023-07-30 LAB — CERVICOVAGINAL ANCILLARY ONLY
Bacterial Vaginitis (gardnerella): POSITIVE — AB
Candida Glabrata: NEGATIVE
Candida Vaginitis: POSITIVE — AB
Chlamydia: NEGATIVE
Comment: NEGATIVE
Comment: NEGATIVE
Comment: NEGATIVE
Comment: NEGATIVE
Comment: NEGATIVE
Comment: NORMAL
Neisseria Gonorrhea: NEGATIVE
Trichomonas: NEGATIVE

## 2023-08-02 ENCOUNTER — Encounter: Payer: Self-pay | Admitting: Obstetrics and Gynecology

## 2023-08-02 ENCOUNTER — Other Ambulatory Visit (HOSPITAL_COMMUNITY): Payer: Self-pay

## 2023-08-02 MED ORDER — METRONIDAZOLE 0.75 % VA GEL
1.0000 | Freq: Every day | VAGINAL | 1 refills | Status: DC
  Filled 2023-08-02: qty 70, 5d supply, fill #0

## 2023-08-02 MED ORDER — FLUCONAZOLE 150 MG PO TABS
150.0000 mg | ORAL_TABLET | Freq: Once | ORAL | 0 refills | Status: AC
  Filled 2023-08-02: qty 1, 1d supply, fill #0

## 2023-08-02 NOTE — Addendum Note (Signed)
Addended by: Harvie Bridge on: 08/02/2023 11:41 AM   Modules accepted: Orders

## 2023-08-26 ENCOUNTER — Encounter: Payer: Self-pay | Admitting: *Deleted

## 2023-10-08 ENCOUNTER — Other Ambulatory Visit (HOSPITAL_COMMUNITY): Payer: Self-pay

## 2023-10-08 MED ORDER — NYSTATIN 100000 UNIT/GM EX POWD
1.0000 | Freq: Two times a day (BID) | CUTANEOUS | 2 refills | Status: AC
Start: 2023-10-08 — End: ?
  Filled 2023-10-08: qty 60, 25d supply, fill #0

## 2023-10-18 ENCOUNTER — Other Ambulatory Visit (HOSPITAL_COMMUNITY): Payer: Self-pay

## 2023-10-18 MED ORDER — FLUCONAZOLE 150 MG PO TABS
150.0000 mg | ORAL_TABLET | ORAL | 0 refills | Status: DC
Start: 2023-10-18 — End: 2023-12-13
  Filled 2023-10-18: qty 3, 21d supply, fill #0

## 2023-10-26 ENCOUNTER — Other Ambulatory Visit (HOSPITAL_COMMUNITY): Payer: Self-pay

## 2023-10-26 MED ORDER — SEMAGLUTIDE-WEIGHT MANAGEMENT 1 MG/0.5ML ~~LOC~~ SOAJ
1.0000 mg | SUBCUTANEOUS | 0 refills | Status: DC
  Filled 2023-10-26 – 2023-10-28 (×2): qty 2, 28d supply, fill #0

## 2023-10-28 ENCOUNTER — Other Ambulatory Visit (HOSPITAL_COMMUNITY): Payer: Self-pay

## 2023-11-02 ENCOUNTER — Other Ambulatory Visit (HOSPITAL_COMMUNITY): Payer: Self-pay

## 2023-11-02 MED ORDER — DOXYCYCLINE HYCLATE 100 MG PO CAPS
100.0000 mg | ORAL_CAPSULE | Freq: Two times a day (BID) | ORAL | 0 refills | Status: DC
  Filled 2023-11-02: qty 14, 7d supply, fill #0

## 2023-11-03 ENCOUNTER — Other Ambulatory Visit (HOSPITAL_COMMUNITY): Payer: Self-pay

## 2023-11-03 ENCOUNTER — Encounter (HOSPITAL_COMMUNITY): Payer: Self-pay

## 2023-11-04 ENCOUNTER — Other Ambulatory Visit (HOSPITAL_COMMUNITY): Payer: Self-pay

## 2023-11-04 MED ORDER — CETIRIZINE HCL 10 MG PO TABS
10.0000 mg | ORAL_TABLET | Freq: Every day | ORAL | 3 refills | Status: AC
  Filled 2023-11-04: qty 30, 30d supply, fill #0

## 2023-11-04 MED ORDER — DOXYCYCLINE HYCLATE 100 MG PO CAPS
100.0000 mg | ORAL_CAPSULE | Freq: Two times a day (BID) | ORAL | 0 refills | Status: DC
  Filled 2023-11-04: qty 20, 10d supply, fill #0

## 2023-11-04 MED ORDER — WEGOVY 0.25 MG/0.5ML ~~LOC~~ SOAJ
0.2500 mg | SUBCUTANEOUS | 1 refills | Status: DC
Start: 2023-11-04 — End: 2024-01-05
  Filled 2023-11-04 – 2023-11-08 (×3): qty 2, 28d supply, fill #0

## 2023-11-08 ENCOUNTER — Other Ambulatory Visit (HOSPITAL_COMMUNITY): Payer: Self-pay

## 2023-11-08 ENCOUNTER — Other Ambulatory Visit: Payer: Self-pay

## 2023-11-09 ENCOUNTER — Other Ambulatory Visit (HOSPITAL_COMMUNITY): Payer: Self-pay

## 2023-11-09 MED ORDER — HYDROCORTISONE 2.5 % EX LOTN
TOPICAL_LOTION | CUTANEOUS | 2 refills | Status: DC
  Filled 2023-11-09: qty 59, 7d supply, fill #0

## 2023-11-09 MED ORDER — ECONAZOLE NITRATE 1 % EX CREA
1.0000 | TOPICAL_CREAM | Freq: Two times a day (BID) | CUTANEOUS | 2 refills | Status: AC
  Filled 2023-11-09: qty 30, 7d supply, fill #0

## 2023-11-16 ENCOUNTER — Other Ambulatory Visit (HOSPITAL_COMMUNITY): Payer: Self-pay

## 2023-11-24 ENCOUNTER — Ambulatory Visit: Admitting: Plastic Surgery

## 2023-11-24 ENCOUNTER — Encounter: Payer: Self-pay | Admitting: Plastic Surgery

## 2023-11-24 VITALS — BP 141/90 | HR 101 | Ht 68.0 in | Wt 218.0 lb

## 2023-11-24 DIAGNOSIS — M793 Panniculitis, unspecified: Secondary | ICD-10-CM

## 2023-11-24 DIAGNOSIS — E65 Localized adiposity: Secondary | ICD-10-CM | POA: Diagnosis not present

## 2023-11-24 NOTE — Progress Notes (Signed)
 Referring Provider [facility] [address] [place],  [place] [address]   CC:  Chief Complaint  Patient presents with   Advice Only   Skin Problem      Beth Hansen is an 38 y.o. female.  HPI: [name] presents today with complaints of ongoing rashes on the posterior aspect of her pannus and in the intertriginous regions.  The patient has been seen several times over the past 2 months for intractable rashes.  She also notes that is difficult to find close that fit appropriately when she is trying to exercise and the excess skin rubs against her lower abdominal wall worsening the infection and pain.  She would like to have the skin removed.  Allergies  Allergen Reactions   Latex Rash and Hives    Outpatient Encounter Medications as of 11/24/2023  Medication Sig   cetirizine  (ZYRTEC ) 10 MG tablet Take 1 tablet (10 mg total) by mouth daily, for itching.   cyclobenzaprine  (FLEXERIL ) 10 MG tablet Take 1 tablet (10 mg total) by mouth every 8 (eight) hours as needed for muscle spasms.   desogestrel -ethinyl estradiol  (MIRCETTE ) 0.15-0.02/0.01 MG (21/5) tablet Take 1 tablet by mouth daily.   econazole nitrate  1 % cream Apply a thin layer topically 2 (two) times daily for 6 weeks.   fluconazole  (DIFLUCAN ) 150 MG tablet Take 1 tablet (150 mg total) by mouth once a week for 21 days.   hydrocortisone  2.5 % lotion Apply a thin layer topically to affected area(s) twice daily x 2 weeks, then everyday x 2 weeks, then every other day x 2 weeks   nystatin  (MYCOSTATIN /NYSTOP ) powder Apply topically to the affected areas 2 (two) times daily.   Semaglutide -Weight Management (WEGOVY ) 0.25 MG/0.5ML SOAJ Inject 0.25 mg into the skin once a week for obesity   [DISCONTINUED] doxycycline  (VIBRAMYCIN ) 100 MG capsule Take 1 capsule (100 mg total) by mouth 2 (two) times daily, for 10 days.   acetaminophen  (TYLENOL ) 500 MG tablet Take 2 tablets (1,000 mg total) by mouth every 8 (eight)  hours. (Patient not taking: Reported on 10/15/2022)   Semaglutide -Weight Management (WEGOVY ) 0.25 MG/0.5ML SOAJ Inject 0.25 mg into the skin once a week. (Patient not taking: Reported on 11/24/2023)   Semaglutide -Weight Management (WEGOVY ) 0.5 MG/0.5ML SOAJ Inject 0.5 mg into the skin once a week. (Patient not taking: Reported on 11/24/2023)   Semaglutide -Weight Management 1 MG/0.5ML SOAJ Inject 1 mg into the skin once a week. (Patient not taking: Reported on 11/24/2023)   [DISCONTINUED] doxycycline  (VIBRAMYCIN ) 100 MG capsule Take 1 capsule (100 mg total) by mouth 2 (two) times daily for 7 days   [DISCONTINUED] ketotifen  (ZADITOR ) 0.035 % ophthalmic solution Apply 1 drop to affected eye(s) in the morning and in the evening. (Patient not taking: Reported on 07/29/2023)   [DISCONTINUED] metroNIDAZOLE  (METROGEL ) 0.75 % vaginal gel Place 1 Applicatorful vaginally at bedtime for 5 days.   [DISCONTINUED] Semaglutide -Weight Management 1 MG/0.5ML SOAJ Inject 1 mg into the skin once a week.   No facility-administered encounter medications on file as of 11/24/2023.     Past Medical History:  Diagnosis Date   Anemia     Past Surgical History:  Procedure Laterality Date   BREAST BIOPSY Left    IUD REMOVAL N/A 12/06/2013   Procedure: INTRAUTERINE DEVICE (IUD) REMOVAL;  Surgeon: Wendelyn Halter, MD;  Location: [facility];  Service: Gynecology;  Laterality: N/A;   LAPAROSCOPIC TUBAL LIGATION Bilateral 12/06/2013   Procedure: LAPAROSCOPIC BILATERAL TUBAL  CAUTERIZATION USING ELECTROCAUTERY;  Surgeon: Wendelyn Halter, MD;  Location: [facility];  Service: Gynecology;  Laterality: Bilateral;   LAPAROSCOPIC UNILATERAL SALPINGECTOMY Left 09/11/2022   Procedure: LAPAROSCOPIC LEFT SALPINGECTOMY WITH REMOVAL OF ECTOPIC PREGNANCY;  Surgeon: Izell Marsh, MD;  Location: [facility];  Service: Gynecology;  Laterality: Left;   tubal reversal  03/03/2020   WISDOM TOOTH EXTRACTION      Family History  Problem Relation Age of Onset    Hypertension Father    Breast cancer Maternal Aunt        44s   Breast cancer Maternal Uncle    Diabetes Maternal Grandmother    Hypertension Maternal Grandmother    Other Neg Hx     Social History   Social History Narrative   Not on file     Review of Systems General: Denies fevers, chills, weight loss CV: Denies chest pain, shortness of breath, palpitations Abdomen: Excess skin which interferes with daily activity and frequently has rashes and friction irritation.  Physical Exam    11/24/2023    8:09 AM 07/29/2023    1:40 PM 07/24/2023    3:15 PM  Vitals with BMI  Height 5\' 8"     Weight 218 lbs 207 lbs 5 oz   BMI 33.15 31.53   Systolic 141 119 119  Diastolic 90 88 77  Pulse 101 85 88    General:  No acute distress,  Alert and oriented, Non-Toxic, Normal speech and affect Abdomen: Patient has a moderate pannus which hangs just to the level of the symphysis pubis.  She has extensive and rather severe skin breakdown and rashes in this area today. 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  I believe that she would benefit from a panniculectomy.  We discussed the procedure at length including the location of the incisions and the unpredictable nature of scarring and wound healing.  Patient understands that because of her ongoing rashes the area is chronically infected and she is at a much higher risk for wound infection and incisional complications.  The risks of bleeding and seroma formation were also discussed.  She understands that she will need to wear a compressive garment postoperatively and that she will have drains after surgery.  We did discuss the postoperative limitations which include no heavy lifting greater than 20 pounds, no vigorous activity, and no submerging the incisions in water for 6 weeks.  She may return to light activity as tolerated.  She is  encouraged to begin ambulation immediately after surgery to help decrease the risk of DVT.  All questions were answered to her satisfaction.  Photographs were obtained today with her consent.  Will schedule her for a panniculectomy at her request.  Teretha Ferguson 11/24/2023, 8:39 AM

## 2023-11-26 ENCOUNTER — Telehealth: Payer: Self-pay

## 2023-11-26 NOTE — Telephone Encounter (Signed)
 Spoke with patient. She stated someone called stating they were from her insurance co. With a date for her abdominoplasty. I told her that we did not call or send anything in yet for her surgery, that we were waiting on her.  Pt states she is wanting to move forward with panniculectomy. I told her she would hear from [place] or Lovett Ruck, not her insurance company. That a date is picked together.

## 2023-11-30 ENCOUNTER — Encounter: Payer: Self-pay | Admitting: Plastic Surgery

## 2023-11-30 DIAGNOSIS — M793 Panniculitis, unspecified: Secondary | ICD-10-CM

## 2023-12-07 ENCOUNTER — Other Ambulatory Visit (HOSPITAL_COMMUNITY): Payer: Self-pay

## 2023-12-07 MED ORDER — ECONAZOLE NITRATE 1 % EX CREA
1.0000 | TOPICAL_CREAM | Freq: Two times a day (BID) | CUTANEOUS | 2 refills | Status: AC
  Filled 2023-12-07: qty 170, 42d supply, fill #0
  Filled 2023-12-24: qty 170, 85d supply, fill #0

## 2023-12-08 ENCOUNTER — Other Ambulatory Visit (HOSPITAL_COMMUNITY): Payer: Self-pay

## 2023-12-13 ENCOUNTER — Other Ambulatory Visit (HOSPITAL_COMMUNITY): Payer: Self-pay

## 2023-12-13 MED ORDER — CLOTRIMAZOLE 1 % EX CREA
TOPICAL_CREAM | CUTANEOUS | 3 refills | Status: DC
  Filled 2023-12-13: qty 112, 42d supply, fill #0

## 2023-12-13 MED ORDER — WEGOVY 0.5 MG/0.5ML ~~LOC~~ SOAJ
0.5000 mg | SUBCUTANEOUS | 1 refills | Status: DC
  Filled 2023-12-13 – 2023-12-24 (×2): qty 2, 28d supply, fill #0

## 2023-12-13 MED ORDER — ECONAZOLE NITRATE 1 % EX CREA
TOPICAL_CREAM | CUTANEOUS | 2 refills | Status: DC
  Filled 2023-12-13: qty 170, 42d supply, fill #0

## 2023-12-14 ENCOUNTER — Other Ambulatory Visit (HOSPITAL_COMMUNITY): Payer: Self-pay

## 2023-12-21 ENCOUNTER — Other Ambulatory Visit: Payer: Self-pay

## 2023-12-21 ENCOUNTER — Other Ambulatory Visit (HOSPITAL_COMMUNITY): Payer: Self-pay

## 2023-12-23 ENCOUNTER — Other Ambulatory Visit (HOSPITAL_COMMUNITY): Payer: Self-pay

## 2023-12-24 ENCOUNTER — Other Ambulatory Visit (HOSPITAL_COMMUNITY): Payer: Self-pay

## 2024-01-06 ENCOUNTER — Other Ambulatory Visit (HOSPITAL_COMMUNITY): Payer: Self-pay

## 2024-01-06 MED ORDER — WEGOVY 1 MG/0.5ML ~~LOC~~ SOAJ
1.0000 mg | SUBCUTANEOUS | 2 refills | Status: DC
  Filled 2024-01-06 – 2024-01-14 (×2): qty 2, 28d supply, fill #0

## 2024-01-14 ENCOUNTER — Other Ambulatory Visit (HOSPITAL_COMMUNITY): Payer: Self-pay

## 2024-01-19 ENCOUNTER — Other Ambulatory Visit (HOSPITAL_COMMUNITY): Payer: Self-pay

## 2024-02-11 ENCOUNTER — Other Ambulatory Visit (HOSPITAL_COMMUNITY): Payer: Self-pay

## 2024-02-11 MED ORDER — WEGOVY 1 MG/0.5ML ~~LOC~~ SOAJ
1.0000 mg | SUBCUTANEOUS | 2 refills | Status: AC
  Filled 2024-02-11: qty 2, 28d supply, fill #0
  Filled 2024-03-29: qty 2, 28d supply, fill #1

## 2024-02-11 MED ORDER — NORETHIN ACE-ETH ESTRAD-FE 1-20 MG-MCG PO TABS
1.0000 | ORAL_TABLET | Freq: Every day | ORAL | 11 refills | Status: AC
  Filled 2024-02-11: qty 28, 28d supply, fill #0
  Filled 2024-06-05: qty 28, 28d supply, fill #1

## 2024-03-16 ENCOUNTER — Other Ambulatory Visit (HOSPITAL_COMMUNITY): Payer: Self-pay

## 2024-03-16 MED ORDER — WEGOVY 1.7 MG/0.75ML ~~LOC~~ SOAJ
1.7000 mg | SUBCUTANEOUS | 1 refills | Status: AC
  Filled 2024-03-16 – 2024-03-29 (×2): qty 3, 28d supply, fill #0
  Filled 2024-06-05: qty 3, 28d supply, fill #1

## 2024-03-23 ENCOUNTER — Ambulatory Visit (INDEPENDENT_AMBULATORY_CARE_PROVIDER_SITE_OTHER): Admitting: Obstetrics and Gynecology

## 2024-03-23 VITALS — BP 105/68 | HR 81 | Wt 206.8 lb

## 2024-03-23 DIAGNOSIS — Z3046 Encounter for surveillance of implantable subdermal contraceptive: Secondary | ICD-10-CM | POA: Diagnosis not present

## 2024-03-23 NOTE — Progress Notes (Signed)
 Pt is in the office for nexplanon  removal Nexplanon  was inserted on 10/15/22, pt states that she has had irregular bleeding and changes in mood since having it placed Reports PCP prescribed BC pills that she will start after nexplanon  removal.

## 2024-03-23 NOTE — Progress Notes (Signed)
     GYNECOLOGY OFFICE PROCEDURE NOTE  Beth Hansen is a 38 y.o. [id number] here for Nexplanon  removal.  Last pap smear was on 10/23 and was normal.  No other gynecologic concerns.  Nexplanon  Removal Patient identified, informed consent performed, consent signed.   Appropriate time out taken. Nexplanon  site identified.  Area prepped in usual sterile fashon. One ml of 1% lidocaine  was used to anesthetize the area at the distal end of the implant. A small stab incision was made right beside the implant on the distal portion.  The Nexplanon  rod was grasped using hemostats and removed without difficulty.  There was minimal blood loss. There were no complications.  3 ml of 1% lidocaine  was injected around the incision for post-procedure analgesia.  Steri-strips were applied over the small incision.  A pressure bandage was applied to reduce any bruising.  The patient tolerated the procedure well and was given post procedure instructions.  Patient is planning to use OCP for contraception.    Jerilynn Buddle, MD, FACOG Obstetrician & Gynecologist, [facility] for [facility], [facility]

## 2024-03-27 ENCOUNTER — Other Ambulatory Visit (HOSPITAL_COMMUNITY): Payer: Self-pay

## 2024-03-30 ENCOUNTER — Other Ambulatory Visit (HOSPITAL_COMMUNITY): Payer: Self-pay

## 2024-03-30 ENCOUNTER — Other Ambulatory Visit: Payer: Self-pay

## 2024-04-07 ENCOUNTER — Other Ambulatory Visit (HOSPITAL_COMMUNITY): Payer: Self-pay

## 2024-04-07 MED ORDER — FLUCONAZOLE 150 MG PO TABS
150.0000 mg | ORAL_TABLET | Freq: Every day | ORAL | 1 refills | Status: AC
  Filled 2024-04-07: qty 3, 3d supply, fill #0

## 2024-06-05 ENCOUNTER — Other Ambulatory Visit (HOSPITAL_COMMUNITY): Payer: Self-pay

## 2024-06-05 ENCOUNTER — Encounter (HOSPITAL_COMMUNITY): Payer: Self-pay
